# Patient Record
Sex: Female | Born: 1960 | Race: White | Hispanic: No | Marital: Single
Health system: Southern US, Community
[De-identification: ages and names within clinical notes are randomized; demographics above are authoritative.]

---

## 2012-02-05 ENCOUNTER — Inpatient Hospital Stay: Payer: Self-pay | Admitting: Internal Medicine

## 2012-02-05 LAB — CBC
HCT: 40.1 % (ref 35.0–47.0)
HGB: 13.5 g/dL (ref 12.0–16.0)
MCH: 31.5 pg (ref 26.0–34.0)
MCHC: 33.6 g/dL (ref 32.0–36.0)
Platelet: 346 10*3/uL (ref 150–440)
WBC: 10.4 10*3/uL (ref 3.6–11.0)

## 2012-02-05 LAB — COMPREHENSIVE METABOLIC PANEL
Alkaline Phosphatase: 120 U/L (ref 50–136)
BUN: 10 mg/dL (ref 7–18)
Bilirubin,Total: 0.2 mg/dL (ref 0.2–1.0)
Chloride: 106 mmol/L (ref 98–107)
Co2: 23 mmol/L (ref 21–32)
Creatinine: 0.72 mg/dL (ref 0.60–1.30)
EGFR (African American): >60
EGFR (Non-African Amer.): >60
Glucose: 104 mg/dL — ABNORMAL HIGH (ref 65–99)
Osmolality: 279 (ref 275–301)
Potassium: 3.8 mmol/L (ref 3.5–5.1)
SGPT (ALT): 18 U/L
Sodium: 140 mmol/L (ref 136–145)
Total Protein: 7.5 g/dL (ref 6.4–8.2)

## 2012-02-05 LAB — TROPONIN I: Troponin-I: <0.02 ng/mL

## 2012-02-05 LAB — CK TOTAL AND CKMB (NOT AT ARMC)
CK, Total: 58 U/L (ref 21–215)
CK-MB: 0.9 ng/mL (ref 0.5–3.6)

## 2012-02-06 LAB — BASIC METABOLIC PANEL
Anion Gap: 12 (ref 7–16)
Co2: 24 mmol/L (ref 21–32)
EGFR (Non-African Amer.): >60
Glucose: 212 mg/dL — ABNORMAL HIGH (ref 65–99)
Osmolality: 285 (ref 275–301)

## 2012-02-06 LAB — CBC WITH DIFFERENTIAL/PLATELET
Basophil #: 0 10*3/uL (ref 0.0–0.1)
Basophil %: 0 %
Eosinophil %: 0 %
HCT: 35.3 % (ref 35.0–47.0)
Lymphocyte %: 10.5 %
MCH: 31.6 pg (ref 26.0–34.0)
MCV: 95 fL (ref 80–100)
Monocyte #: 0.3 10*3/uL (ref 0.0–0.7)
Monocyte %: 2.7 %
Neutrophil #: 8.4 10*3/uL — ABNORMAL HIGH (ref 1.4–6.5)
Platelet: 309 10*3/uL (ref 150–440)
WBC: 9.7 10*3/uL (ref 3.6–11.0)

## 2012-04-30 LAB — COMPREHENSIVE METABOLIC PANEL
Alkaline Phosphatase: 130 U/L (ref 50–136)
BUN: 3 mg/dL — ABNORMAL LOW (ref 7–18)
Bilirubin,Total: 0.5 mg/dL (ref 0.2–1.0)
Calcium, Total: 8.9 mg/dL (ref 8.5–10.1)
Chloride: 105 mmol/L (ref 98–107)
Creatinine: 0.85 mg/dL (ref 0.60–1.30)
EGFR (Non-African Amer.): >60
Glucose: 130 mg/dL — ABNORMAL HIGH (ref 65–99)
Potassium: 3.4 mmol/L — ABNORMAL LOW (ref 3.5–5.1)
SGPT (ALT): 17 U/L
Total Protein: 7.7 g/dL (ref 6.4–8.2)

## 2012-04-30 LAB — CBC
HGB: 13.1 g/dL (ref 12.0–16.0)
MCH: 31.2 pg (ref 26.0–34.0)
MCHC: 33.1 g/dL (ref 32.0–36.0)
MCV: 94 fL (ref 80–100)
Platelet: 326 10*3/uL (ref 150–440)

## 2012-04-30 LAB — CK TOTAL AND CKMB (NOT AT ARMC)
CK, Total: 61 U/L (ref 21–215)
CK-MB: 0.8 ng/mL (ref 0.5–3.6)

## 2012-04-30 LAB — PRO B NATRIURETIC PEPTIDE: B-Type Natriuretic Peptide: 128 pg/mL — ABNORMAL HIGH (ref 0–125)

## 2012-04-30 LAB — PROTIME-INR
INR: 0.9
Prothrombin Time: 12.9 secs (ref 11.5–14.7)

## 2012-05-01 ENCOUNTER — Inpatient Hospital Stay: Payer: Self-pay | Admitting: Internal Medicine

## 2012-06-09 ENCOUNTER — Inpatient Hospital Stay: Payer: Self-pay | Admitting: Internal Medicine

## 2012-06-09 LAB — CBC
MCH: 30.8 pg (ref 26.0–34.0)
MCHC: 33.4 g/dL (ref 32.0–36.0)
MCV: 92 fL (ref 80–100)
Platelet: 340 10*3/uL (ref 150–440)
RDW: 13.7 % (ref 11.5–14.5)

## 2012-06-09 LAB — BASIC METABOLIC PANEL
Anion Gap: 8 (ref 7–16)
Co2: 26 mmol/L (ref 21–32)
Creatinine: 0.66 mg/dL (ref 0.60–1.30)
EGFR (African American): >60
Glucose: 117 mg/dL — ABNORMAL HIGH (ref 65–99)
Osmolality: 275 (ref 275–301)
Sodium: 139 mmol/L (ref 136–145)

## 2012-06-14 LAB — CULTURE, BLOOD (SINGLE)

## 2012-07-09 ENCOUNTER — Inpatient Hospital Stay: Payer: Self-pay | Admitting: Specialist

## 2012-07-09 LAB — COMPREHENSIVE METABOLIC PANEL
Alkaline Phosphatase: 129 U/L (ref 50–136)
Calcium, Total: 8.7 mg/dL (ref 8.5–10.1)
Co2: 27 mmol/L (ref 21–32)
Creatinine: 0.76 mg/dL (ref 0.60–1.30)
EGFR (Non-African Amer.): >60
Osmolality: 276 (ref 275–301)
SGOT(AST): 15 U/L (ref 15–37)
SGPT (ALT): 20 U/L (ref 12–78)
Sodium: 139 mmol/L (ref 136–145)
Total Protein: 7.6 g/dL (ref 6.4–8.2)

## 2012-07-09 LAB — CBC WITH DIFFERENTIAL/PLATELET
Eosinophil #: 0.9 10*3/uL — ABNORMAL HIGH (ref 0.0–0.7)
Eosinophil %: 10.7 %
HCT: 37.6 % (ref 35.0–47.0)
HGB: 12.6 g/dL (ref 12.0–16.0)
Lymphocyte #: 3.4 10*3/uL (ref 1.0–3.6)
MCHC: 33.7 g/dL (ref 32.0–36.0)
MCV: 93 fL (ref 80–100)
Monocyte #: 0.6 x10 3/mm (ref 0.2–0.9)
Monocyte %: 7.6 %
Neutrophil #: 3.4 10*3/uL (ref 1.4–6.5)
Platelet: 346 10*3/uL (ref 150–440)

## 2012-07-09 LAB — CK TOTAL AND CKMB (NOT AT ARMC)
CK, Total: 60 U/L (ref 21–215)
CK, Total: 59 U/L (ref 21–215)
CK, Total: 63 U/L (ref 21–215)
CK-MB: 1.3 ng/mL (ref 0.5–3.6)

## 2012-07-09 LAB — TROPONIN I: Troponin-I: <0.02 ng/mL

## 2012-07-09 LAB — T4, FREE: Free Thyroxine: 1.01 ng/dL (ref 0.76–1.46)

## 2012-07-10 LAB — CBC WITH DIFFERENTIAL/PLATELET
Basophil %: 0.4 %
Eosinophil #: 0 10*3/uL (ref 0.0–0.7)
Eosinophil %: 0 %
HGB: 12.3 g/dL (ref 12.0–16.0)
Lymphocyte %: 10.4 %
MCHC: 33.5 g/dL (ref 32.0–36.0)
Monocyte #: 0.3 x10 3/mm (ref 0.2–0.9)
Neutrophil %: 86.4 %
Platelet: 331 10*3/uL (ref 150–440)
RBC: 3.94 10*6/uL (ref 3.80–5.20)
RDW: 14.1 % (ref 11.5–14.5)
WBC: 10.5 10*3/uL (ref 3.6–11.0)

## 2012-07-10 LAB — BASIC METABOLIC PANEL
Anion Gap: 9 (ref 7–16)
Calcium, Total: 9.2 mg/dL (ref 8.5–10.1)
Chloride: 105 mmol/L (ref 98–107)
Co2: 25 mmol/L (ref 21–32)
Creatinine: 0.68 mg/dL (ref 0.60–1.30)
EGFR (African American): >60
Osmolality: 278 (ref 275–301)
Sodium: 139 mmol/L (ref 136–145)

## 2012-07-10 LAB — MAGNESIUM: Magnesium: 2.3 mg/dL

## 2012-07-15 LAB — CULTURE, BLOOD (SINGLE)

## 2012-08-15 LAB — COMPREHENSIVE METABOLIC PANEL
Albumin: 4.1 g/dL (ref 3.4–5.0)
Alkaline Phosphatase: 155 U/L — ABNORMAL HIGH (ref 50–136)
Anion Gap: 12 (ref 7–16)
BUN: 4 mg/dL — ABNORMAL LOW (ref 7–18)
Bilirubin,Total: 0.3 mg/dL (ref 0.2–1.0)
Calcium, Total: 9 mg/dL (ref 8.5–10.1)
Co2: 24 mmol/L (ref 21–32)
Creatinine: 0.81 mg/dL (ref 0.60–1.30)
Glucose: 97 mg/dL (ref 65–99)
Osmolality: 278 (ref 275–301)
Potassium: 3.3 mmol/L — ABNORMAL LOW (ref 3.5–5.1)
SGOT(AST): 25 U/L (ref 15–37)
SGPT (ALT): 19 U/L (ref 12–78)
Sodium: 141 mmol/L (ref 136–145)
Total Protein: 7.8 g/dL (ref 6.4–8.2)

## 2012-08-15 LAB — CBC WITH DIFFERENTIAL/PLATELET
Basophil #: 0.1 10*3/uL (ref 0.0–0.1)
Basophil %: 1.3 %
Eosinophil #: 0.8 10*3/uL — ABNORMAL HIGH (ref 0.0–0.7)
Eosinophil %: 7.8 %
HCT: 39.2 % (ref 35.0–47.0)
HGB: 13.5 g/dL (ref 12.0–16.0)
MCH: 31.7 pg (ref 26.0–34.0)
MCHC: 34.4 g/dL (ref 32.0–36.0)
MCV: 92 fL (ref 80–100)
Monocyte #: 0.8 x10 3/mm (ref 0.2–0.9)

## 2012-08-16 ENCOUNTER — Inpatient Hospital Stay: Payer: Self-pay | Admitting: Internal Medicine

## 2012-09-08 ENCOUNTER — Inpatient Hospital Stay: Payer: Self-pay | Admitting: Internal Medicine

## 2012-09-08 LAB — TROPONIN I: Troponin-I: <0.02 ng/mL

## 2012-09-08 LAB — CBC WITH DIFFERENTIAL/PLATELET
Basophil #: 0.1 x10 3/mm 3
Basophil %: 0.9 %
Eosinophil #: 1.6 x10 3/mm 3 — ABNORMAL HIGH
Eosinophil %: 13.8 %
HCT: 37.1 %
HGB: 12.4 g/dL
Lymphocyte %: 34.8 %
Lymphs Abs: 4 x10 3/mm 3 — ABNORMAL HIGH
MCH: 31.1 pg
MCHC: 33.4 g/dL
MCV: 93 fL
Monocyte #: 0.8 "x10 3/mm "
Monocyte %: 6.8 %
Neutrophil #: 5.1 x10 3/mm 3
Neutrophil %: 43.7 %
Platelet: 350 x10 3/mm 3
RBC: 3.99 X10 6/mm 3
RDW: 13.7 %
WBC: 11.6 x10 3/mm 3 — ABNORMAL HIGH

## 2012-09-08 LAB — COMPREHENSIVE METABOLIC PANEL WITH GFR
Albumin: 3.7 g/dL
Alkaline Phosphatase: 123 U/L
Anion Gap: 8
BUN: 9 mg/dL
Bilirubin,Total: 0.2 mg/dL
Calcium, Total: 8.9 mg/dL
Chloride: 108 mmol/L — ABNORMAL HIGH
Co2: 26 mmol/L
Creatinine: 0.79 mg/dL
EGFR (African American): >60
EGFR (Non-African Amer.): >60
Glucose: 132 mg/dL — ABNORMAL HIGH
Osmolality: 284
Potassium: 3.6 mmol/L
SGOT(AST): 23 U/L
SGPT (ALT): 17 U/L
Sodium: 142 mmol/L
Total Protein: 7.1 g/dL

## 2012-09-13 LAB — CULTURE, BLOOD (SINGLE)

## 2012-10-08 ENCOUNTER — Emergency Department: Payer: Self-pay | Admitting: Internal Medicine

## 2012-10-08 LAB — CBC
HCT: 38.8 % (ref 35.0–47.0)
HGB: 13.2 g/dL (ref 12.0–16.0)
MCV: 92 fL (ref 80–100)
RBC: 4.23 10*6/uL (ref 3.80–5.20)
WBC: 6.1 10*3/uL (ref 3.6–11.0)

## 2012-10-08 LAB — COMPREHENSIVE METABOLIC PANEL
Albumin: 3.9 g/dL (ref 3.4–5.0)
Anion Gap: 6 — ABNORMAL LOW (ref 7–16)
BUN: 8 mg/dL (ref 7–18)
Calcium, Total: 9.2 mg/dL (ref 8.5–10.1)
Chloride: 105 mmol/L (ref 98–107)
EGFR (Non-African Amer.): >60
Glucose: 121 mg/dL — ABNORMAL HIGH (ref 65–99)
SGOT(AST): 29 U/L (ref 15–37)
SGPT (ALT): 21 U/L (ref 12–78)
Total Protein: 7.3 g/dL (ref 6.4–8.2)

## 2012-10-08 LAB — TROPONIN I: Troponin-I: <0.02 ng/mL

## 2012-11-26 ENCOUNTER — Inpatient Hospital Stay: Payer: Self-pay | Admitting: Internal Medicine

## 2012-11-26 LAB — COMPREHENSIVE METABOLIC PANEL
Alkaline Phosphatase: 100 U/L (ref 50–136)
Anion Gap: 8 (ref 7–16)
BUN: 15 mg/dL (ref 7–18)
Bilirubin,Total: 0.2 mg/dL (ref 0.2–1.0)
Calcium, Total: 8.4 mg/dL — ABNORMAL LOW (ref 8.5–10.1)
Co2: 24 mmol/L (ref 21–32)
EGFR (Non-African Amer.): >60
Glucose: 120 mg/dL — ABNORMAL HIGH (ref 65–99)
Potassium: 3.9 mmol/L (ref 3.5–5.1)
SGOT(AST): 19 U/L (ref 15–37)
SGPT (ALT): 19 U/L (ref 12–78)
Sodium: 140 mmol/L (ref 136–145)
Total Protein: 7.2 g/dL (ref 6.4–8.2)

## 2012-11-26 LAB — CBC
HCT: 36.8 % (ref 35.0–47.0)
HGB: 12.4 g/dL (ref 12.0–16.0)
MCH: 30.5 pg (ref 26.0–34.0)
MCV: 91 fL (ref 80–100)
WBC: 9.1 10*3/uL (ref 3.6–11.0)

## 2012-11-26 LAB — CK TOTAL AND CKMB (NOT AT ARMC): CK-MB: 1.3 ng/mL (ref 0.5–3.6)

## 2012-11-26 LAB — PRO B NATRIURETIC PEPTIDE: B-Type Natriuretic Peptide: 75 pg/mL (ref 0–125)

## 2012-11-27 LAB — CBC WITH DIFFERENTIAL/PLATELET
Basophil %: 0.2 %
Eosinophil %: 0 %
HCT: 35.2 % (ref 35.0–47.0)
HGB: 11.5 g/dL — ABNORMAL LOW (ref 12.0–16.0)
MCH: 29.9 pg (ref 26.0–34.0)
MCHC: 32.8 g/dL (ref 32.0–36.0)
MCV: 91 fL (ref 80–100)
Monocyte #: 0.2 x10 3/mm (ref 0.2–0.9)
Neutrophil #: 9.2 10*3/uL — ABNORMAL HIGH (ref 1.4–6.5)
Platelet: 337 10*3/uL (ref 150–440)
RBC: 3.86 10*6/uL (ref 3.80–5.20)

## 2012-11-27 LAB — BASIC METABOLIC PANEL
Anion Gap: 8 (ref 7–16)
BUN: 14 mg/dL (ref 7–18)
Calcium, Total: 9.1 mg/dL (ref 8.5–10.1)
Co2: 23 mmol/L (ref 21–32)
EGFR (Non-African Amer.): >60
Glucose: 152 mg/dL — ABNORMAL HIGH (ref 65–99)
Sodium: 139 mmol/L (ref 136–145)

## 2012-11-27 LAB — MAGNESIUM: Magnesium: 2.2 mg/dL

## 2013-01-17 ENCOUNTER — Inpatient Hospital Stay: Payer: Self-pay | Admitting: Specialist

## 2013-01-17 LAB — CBC
HCT: 36.5 % (ref 35.0–47.0)
MCHC: 32.6 g/dL (ref 32.0–36.0)
MCV: 91 fL (ref 80–100)
Platelet: 367 10*3/uL (ref 150–440)
RBC: 4.03 10*6/uL (ref 3.80–5.20)
RDW: 14.6 % — ABNORMAL HIGH (ref 11.5–14.5)
WBC: 10.4 10*3/uL (ref 3.6–11.0)

## 2013-01-17 LAB — COMPREHENSIVE METABOLIC PANEL
Alkaline Phosphatase: 117 U/L (ref 50–136)
Anion Gap: 8 (ref 7–16)
BUN: 10 mg/dL (ref 7–18)
Bilirubin,Total: 0.2 mg/dL (ref 0.2–1.0)
EGFR (African American): >60
EGFR (Non-African Amer.): >60
Glucose: 109 mg/dL — ABNORMAL HIGH (ref 65–99)
Osmolality: 277 (ref 275–301)
Potassium: 4.2 mmol/L (ref 3.5–5.1)
SGOT(AST): 24 U/L (ref 15–37)
SGPT (ALT): 21 U/L (ref 12–78)
Total Protein: 7.5 g/dL (ref 6.4–8.2)

## 2013-01-17 LAB — TROPONIN I: Troponin-I: <0.02 ng/mL

## 2013-01-17 LAB — PRO B NATRIURETIC PEPTIDE: B-Type Natriuretic Peptide: 57 pg/mL (ref 0–125)

## 2013-01-17 LAB — CK TOTAL AND CKMB (NOT AT ARMC): CK-MB: 0.6 ng/mL (ref 0.5–3.6)

## 2013-01-18 LAB — BASIC METABOLIC PANEL
BUN: 12 mg/dL (ref 7–18)
Chloride: 105 mmol/L (ref 98–107)
Creatinine: 0.91 mg/dL (ref 0.60–1.30)
EGFR (Non-African Amer.): >60
Glucose: 132 mg/dL — ABNORMAL HIGH (ref 65–99)
Sodium: 139 mmol/L (ref 136–145)

## 2013-01-18 LAB — CBC WITH DIFFERENTIAL/PLATELET
Basophil #: 0 10*3/uL (ref 0.0–0.1)
Basophil %: 0.2 %
Eosinophil #: 0 10*3/uL (ref 0.0–0.7)
Eosinophil %: 0.2 %
MCH: 30.5 pg (ref 26.0–34.0)
MCHC: 33.6 g/dL (ref 32.0–36.0)
Monocyte #: 0 x10 3/mm — ABNORMAL LOW (ref 0.2–0.9)
Monocyte %: 0.6 %
Neutrophil #: 7.4 10*3/uL — ABNORMAL HIGH (ref 1.4–6.5)
Platelet: 324 10*3/uL (ref 150–440)
RBC: 3.84 10*6/uL (ref 3.80–5.20)
RDW: 14.8 % — ABNORMAL HIGH (ref 11.5–14.5)
WBC: 7.9 10*3/uL (ref 3.6–11.0)

## 2013-02-05 ENCOUNTER — Inpatient Hospital Stay: Payer: Self-pay | Admitting: Internal Medicine

## 2013-02-05 LAB — COMPREHENSIVE METABOLIC PANEL
Albumin: 3.9 g/dL (ref 3.4–5.0)
Alkaline Phosphatase: 130 U/L (ref 50–136)
BUN: 11 mg/dL (ref 7–18)
Bilirubin,Total: 0.2 mg/dL (ref 0.2–1.0)
Calcium, Total: 8.6 mg/dL (ref 8.5–10.1)
Creatinine: 0.71 mg/dL (ref 0.60–1.30)
EGFR (African American): >60
Glucose: 106 mg/dL — ABNORMAL HIGH (ref 65–99)
Osmolality: 275 (ref 275–301)
SGOT(AST): 27 U/L (ref 15–37)
Total Protein: 7.4 g/dL (ref 6.4–8.2)

## 2013-02-05 LAB — CBC WITH DIFFERENTIAL/PLATELET
Basophil #: 0.1 10*3/uL (ref 0.0–0.1)
Eosinophil %: 7.9 %
HCT: 36.3 % (ref 35.0–47.0)
HGB: 12 g/dL (ref 12.0–16.0)
MCH: 29.7 pg (ref 26.0–34.0)
MCHC: 32.9 g/dL (ref 32.0–36.0)
MCV: 90 fL (ref 80–100)
Monocyte %: 8.4 %
Neutrophil #: 6.2 10*3/uL (ref 1.4–6.5)
Neutrophil %: 50.1 %

## 2013-02-05 LAB — TROPONIN I: Troponin-I: <0.02 ng/mL

## 2013-02-06 LAB — CBC WITH DIFFERENTIAL/PLATELET
Basophil #: 0 10*3/uL (ref 0.0–0.1)
Basophil %: 0.1 %
Eosinophil %: 0 %
HGB: 11.2 g/dL — ABNORMAL LOW (ref 12.0–16.0)
Lymphocyte #: 0.4 10*3/uL — ABNORMAL LOW (ref 1.0–3.6)
MCH: 30.1 pg (ref 26.0–34.0)
MCHC: 33 g/dL (ref 32.0–36.0)
MCV: 91 fL (ref 80–100)
Monocyte %: 1 %
Neutrophil #: 7.4 10*3/uL — ABNORMAL HIGH (ref 1.4–6.5)
Neutrophil %: 93.6 %
Platelet: 313 10*3/uL (ref 150–440)
RBC: 3.71 10*6/uL — ABNORMAL LOW (ref 3.80–5.20)
WBC: 7.9 10*3/uL (ref 3.6–11.0)

## 2013-02-06 LAB — BASIC METABOLIC PANEL
Calcium, Total: 8.7 mg/dL (ref 8.5–10.1)
Co2: 25 mmol/L (ref 21–32)

## 2013-02-11 LAB — CULTURE, BLOOD (SINGLE)

## 2013-02-28 ENCOUNTER — Inpatient Hospital Stay: Payer: Self-pay | Admitting: Student

## 2013-02-28 LAB — URINALYSIS, COMPLETE
Bacteria: NONE SEEN
Blood: NEGATIVE
Glucose,UR: 50 mg/dL (ref 0–75)
Ketone: NEGATIVE
Leukocyte Esterase: NEGATIVE
RBC,UR: 1 /HPF (ref 0–5)

## 2013-02-28 LAB — CBC
HCT: 34.2 % — ABNORMAL LOW (ref 35.0–47.0)
HGB: 11.5 g/dL — ABNORMAL LOW (ref 12.0–16.0)
MCH: 30.1 pg (ref 26.0–34.0)
Platelet: 308 10*3/uL (ref 150–440)
RBC: 3.81 10*6/uL (ref 3.80–5.20)
WBC: 8.6 10*3/uL (ref 3.6–11.0)

## 2013-02-28 LAB — COMPREHENSIVE METABOLIC PANEL
Anion Gap: 6 — ABNORMAL LOW (ref 7–16)
Calcium, Total: 9 mg/dL (ref 8.5–10.1)
Chloride: 103 mmol/L (ref 98–107)
Creatinine: 0.74 mg/dL (ref 0.60–1.30)
EGFR (Non-African Amer.): >60
Glucose: 106 mg/dL — ABNORMAL HIGH (ref 65–99)
Osmolality: 272 (ref 275–301)
Potassium: 4.6 mmol/L (ref 3.5–5.1)
SGPT (ALT): 22 U/L (ref 12–78)
Total Protein: 7.9 g/dL (ref 6.4–8.2)

## 2013-03-01 LAB — CBC WITH DIFFERENTIAL/PLATELET
Eosinophil #: 0 10*3/uL (ref 0.0–0.7)
Eosinophil %: 0 %
HGB: 11.2 g/dL — ABNORMAL LOW (ref 12.0–16.0)
Lymphocyte %: 10.9 %
MCH: 29.9 pg (ref 26.0–34.0)
MCHC: 33.3 g/dL (ref 32.0–36.0)
MCV: 90 fL (ref 80–100)
Monocyte #: 0.5 x10 3/mm (ref 0.2–0.9)
Neutrophil #: 7.9 10*3/uL — ABNORMAL HIGH (ref 1.4–6.5)
Neutrophil %: 83.9 %
RDW: 14.3 % (ref 11.5–14.5)
WBC: 9.4 10*3/uL (ref 3.6–11.0)

## 2013-03-01 LAB — BASIC METABOLIC PANEL
Anion Gap: 7 (ref 7–16)
BUN: 9 mg/dL (ref 7–18)
Calcium, Total: 9.2 mg/dL (ref 8.5–10.1)
Chloride: 106 mmol/L (ref 98–107)
Co2: 24 mmol/L (ref 21–32)
Glucose: 142 mg/dL — ABNORMAL HIGH (ref 65–99)
Osmolality: 275 (ref 275–301)
Potassium: 4 mmol/L (ref 3.5–5.1)

## 2013-03-02 LAB — BASIC METABOLIC PANEL
BUN: 16 mg/dL (ref 7–18)
Calcium, Total: 9.2 mg/dL (ref 8.5–10.1)
Chloride: 105 mmol/L (ref 98–107)
Co2: 28 mmol/L (ref 21–32)
Creatinine: 0.9 mg/dL (ref 0.60–1.30)
EGFR (African American): >60
EGFR (Non-African Amer.): >60
Osmolality: 279 (ref 275–301)
Sodium: 138 mmol/L (ref 136–145)

## 2013-03-02 LAB — CBC WITH DIFFERENTIAL/PLATELET
Eosinophil #: 0 10*3/uL (ref 0.0–0.7)
Eosinophil %: 0 %
HCT: 31.8 % — ABNORMAL LOW (ref 35.0–47.0)
HGB: 10.5 g/dL — ABNORMAL LOW (ref 12.0–16.0)
Lymphocyte #: 1.1 10*3/uL (ref 1.0–3.6)
MCHC: 33.1 g/dL (ref 32.0–36.0)
MCV: 90 fL (ref 80–100)
Monocyte #: 0.6 x10 3/mm (ref 0.2–0.9)
Monocyte %: 4.1 %
Neutrophil #: 12 10*3/uL — ABNORMAL HIGH (ref 1.4–6.5)
Neutrophil %: 88 %
Platelet: 339 10*3/uL (ref 150–440)
RBC: 3.52 10*6/uL — ABNORMAL LOW (ref 3.80–5.20)
WBC: 13.6 10*3/uL — ABNORMAL HIGH (ref 3.6–11.0)

## 2013-04-02 ENCOUNTER — Inpatient Hospital Stay: Payer: Self-pay | Admitting: Student

## 2013-04-02 LAB — BASIC METABOLIC PANEL
Anion Gap: 5 — ABNORMAL LOW (ref 7–16)
BUN: 7 mg/dL (ref 7–18)
Calcium, Total: 8.6 mg/dL (ref 8.5–10.1)
Creatinine: 0.82 mg/dL (ref 0.60–1.30)
Potassium: 3.4 mmol/L — ABNORMAL LOW (ref 3.5–5.1)

## 2013-04-02 LAB — CBC
MCH: 29.8 pg (ref 26.0–34.0)
MCHC: 33.4 g/dL (ref 32.0–36.0)
MCV: 89 fL (ref 80–100)
Platelet: 386 10*3/uL (ref 150–440)
RBC: 3.94 10*6/uL (ref 3.80–5.20)
RDW: 14.4 % (ref 11.5–14.5)
WBC: 9.1 10*3/uL (ref 3.6–11.0)

## 2013-04-03 LAB — CBC WITH DIFFERENTIAL/PLATELET
Basophil #: 0 10*3/uL (ref 0.0–0.1)
Basophil %: 0.2 %
Eosinophil %: 0 %
HCT: 33.9 % — ABNORMAL LOW (ref 35.0–47.0)
Lymphocyte #: 0.6 10*3/uL — ABNORMAL LOW (ref 1.0–3.6)
MCV: 88 fL (ref 80–100)
Monocyte #: 0.1 x10 3/mm — ABNORMAL LOW (ref 0.2–0.9)
Monocyte %: 1.2 %
Neutrophil #: 4.9 10*3/uL (ref 1.4–6.5)
Platelet: 367 10*3/uL (ref 150–440)
RBC: 3.83 10*6/uL (ref 3.80–5.20)
RDW: 14.5 % (ref 11.5–14.5)

## 2013-04-03 LAB — MAGNESIUM: Magnesium: 2 mg/dL

## 2013-05-07 ENCOUNTER — Inpatient Hospital Stay: Payer: Self-pay | Admitting: Internal Medicine

## 2013-05-07 LAB — COMPREHENSIVE METABOLIC PANEL
Bilirubin,Total: 0.2 mg/dL (ref 0.2–1.0)
Chloride: 106 mmol/L (ref 98–107)
Creatinine: 0.89 mg/dL (ref 0.60–1.30)
EGFR (African American): >60
EGFR (Non-African Amer.): >60
Osmolality: 285 (ref 275–301)
SGOT(AST): 21 U/L (ref 15–37)
SGPT (ALT): 20 U/L (ref 12–78)
Total Protein: 7.3 g/dL (ref 6.4–8.2)

## 2013-05-07 LAB — CBC
HCT: 36.3 % (ref 35.0–47.0)
MCH: 29.2 pg (ref 26.0–34.0)
MCHC: 33.2 g/dL (ref 32.0–36.0)
Platelet: 389 10*3/uL (ref 150–440)
RDW: 14.7 % — ABNORMAL HIGH (ref 11.5–14.5)
WBC: 8.5 10*3/uL (ref 3.6–11.0)

## 2013-05-09 LAB — THEOPHYLLINE LEVEL: Theophylline: 9.5 ug/mL — ABNORMAL LOW (ref 10.0–20.0)

## 2013-07-04 LAB — CBC WITH DIFFERENTIAL/PLATELET
Basophil %: 0.9 %
Eosinophil #: 0.8 10*3/uL — ABNORMAL HIGH (ref 0.0–0.7)
Eosinophil %: 8.3 %
HCT: 35.3 % (ref 35.0–47.0)
HGB: 11.9 g/dL — ABNORMAL LOW (ref 12.0–16.0)
Lymphocyte %: 42.5 %
MCH: 29.4 pg (ref 26.0–34.0)
MCHC: 33.6 g/dL (ref 32.0–36.0)
Monocyte #: 0.9 x10 3/mm (ref 0.2–0.9)
Monocyte %: 8.8 %
Platelet: 414 10*3/uL (ref 150–440)
RDW: 15.4 % — ABNORMAL HIGH (ref 11.5–14.5)
WBC: 9.8 10*3/uL (ref 3.6–11.0)

## 2013-07-04 LAB — PROTIME-INR
INR: 1
Prothrombin Time: 13.1 secs (ref 11.5–14.7)

## 2013-07-05 ENCOUNTER — Inpatient Hospital Stay: Payer: Self-pay | Admitting: Internal Medicine

## 2013-07-05 LAB — COMPREHENSIVE METABOLIC PANEL
Albumin: 3.4 g/dL (ref 3.4–5.0)
Alkaline Phosphatase: 130 U/L (ref 50–136)
Anion Gap: 4 — ABNORMAL LOW (ref 7–16)
BUN: 9 mg/dL (ref 7–18)
Bilirubin,Total: 0.2 mg/dL (ref 0.2–1.0)
Calcium, Total: 9.4 mg/dL (ref 8.5–10.1)
Chloride: 104 mmol/L (ref 98–107)
Co2: 29 mmol/L (ref 21–32)
Creatinine: 0.93 mg/dL (ref 0.60–1.30)
EGFR (African American): >60
EGFR (Non-African Amer.): >60
Glucose: 105 mg/dL — ABNORMAL HIGH (ref 65–99)
Osmolality: 273 (ref 275–301)
Potassium: 3.7 mmol/L (ref 3.5–5.1)
SGOT(AST): 21 U/L (ref 15–37)
Sodium: 137 mmol/L (ref 136–145)
Total Protein: 7 g/dL (ref 6.4–8.2)

## 2013-07-05 LAB — TROPONIN I: Troponin-I: <0.02 ng/mL

## 2013-07-05 LAB — CK TOTAL AND CKMB (NOT AT ARMC): CK, Total: 46 U/L (ref 21–215)

## 2013-07-06 LAB — CBC WITH DIFFERENTIAL/PLATELET
Basophil #: 0 10*3/uL (ref 0.0–0.1)
Eosinophil %: 0 %
HCT: 34 % — ABNORMAL LOW (ref 35.0–47.0)
HGB: 11.5 g/dL — ABNORMAL LOW (ref 12.0–16.0)
Lymphocyte %: 6.5 %
MCV: 88 fL (ref 80–100)
Monocyte #: 0.4 x10 3/mm (ref 0.2–0.9)
Monocyte %: 3.5 %
Neutrophil %: 90 %
Platelet: 425 10*3/uL (ref 150–440)
WBC: 10.8 10*3/uL (ref 3.6–11.0)

## 2013-07-06 LAB — BASIC METABOLIC PANEL
Anion Gap: 8 (ref 7–16)
Co2: 26 mmol/L (ref 21–32)
Creatinine: 0.9 mg/dL (ref 0.60–1.30)
EGFR (Non-African Amer.): >60
Glucose: 171 mg/dL — ABNORMAL HIGH (ref 65–99)
Osmolality: 276 (ref 275–301)
Potassium: 4.4 mmol/L (ref 3.5–5.1)
Sodium: 137 mmol/L (ref 136–145)

## 2013-07-07 LAB — THEOPHYLLINE LEVEL: Theophylline: 10.5 ug/mL (ref 10.0–20.0)

## 2013-08-08 ENCOUNTER — Emergency Department: Payer: Self-pay | Admitting: Emergency Medicine

## 2013-08-08 LAB — COMPREHENSIVE METABOLIC PANEL
Anion Gap: 7 (ref 7–16)
Bilirubin,Total: 0.2 mg/dL (ref 0.2–1.0)
Calcium, Total: 9.5 mg/dL (ref 8.5–10.1)
Chloride: 102 mmol/L (ref 98–107)
Creatinine: 0.88 mg/dL (ref 0.60–1.30)
EGFR (Non-African Amer.): >60
Glucose: 97 mg/dL (ref 65–99)
Osmolality: 272 (ref 275–301)
SGPT (ALT): 18 U/L (ref 12–78)
Sodium: 137 mmol/L (ref 136–145)
Total Protein: 7.1 g/dL (ref 6.4–8.2)

## 2013-08-08 LAB — CBC
MCH: 29.8 pg (ref 26.0–34.0)
MCHC: 34.4 g/dL (ref 32.0–36.0)
RDW: 15.6 % — ABNORMAL HIGH (ref 11.5–14.5)

## 2013-08-08 LAB — TROPONIN I: Troponin-I: <0.02 ng/mL

## 2013-08-08 LAB — CK TOTAL AND CKMB (NOT AT ARMC): CK, Total: 49 U/L (ref 21–215)

## 2015-02-19 NOTE — Discharge Summary (Signed)
PATIENT NAME:  Cassandra Porter, Cassandra Porter MR#:  [id number] DATE OF BIRTH:  1961-07-11  DATE OF ADMISSION:  09/08/2012 DATE OF DISCHARGE:  09/09/2012  PRESENTING COMPLAINT: Shortness of breath.   DISCHARGE DIAGNOSES:  1. Acute on chronic obstructive pulmonary disease exacerbation.  2. Acute on chronic respiratory failure.   CONDITION ON DISCHARGE: Fair. Saturations are 93% on 3 liters nasal cannula oxygen.   CODE STATUS:  FULL CODE.      DISCHARGE MEDICATIONS:  1. Prednisone taper.  2. Melatonin 5 mg, 3 tablets at bedtime.  3. DuoNebs 3 mL every 4 hours as needed.  4. Theophylline ER 200 mg b.i.d.  5. Advair 250/50, 1 puff b.i.d.  6. Spiriva 18 mcg inhalation daily.  7. Protonix 40 mg daily.  8. Azelastine 1 spray nasally b.i.d.  9. Anemia 5 mg, 1 tablet once a day at bedtime.   HOME OXYGEN: Continue your home oxygen as before, 3 liters nasal cannula continuous.   FOLLOWUP:  1. Follow up with Dr. Meredeth IdeFleming on appointment in November.  2. Follow up with Doristine MangoElizabeth White, Nurse Practitioner on 09/26/2012 at 9:30 a.m. [facility].   BRIEF SUMMARY OF HOSPITAL COURSE: Cassandra Porter is a 54 year old Caucasian female well known to our service from many admissions for similar complaints, comes in with:   1. Acute on chronic respiratory failure secondary to chronic obstructive pulmonary disease exacerbation: The patient was started on IV Solu-Medrol, changed to p.o. taper. She improved well, continued on her Advair, Spiriva, theophylline. She was already on home oxygen. Smoking cessation was advised. The patient states she quit about two days ago.  2. GERD: Continue Protonix.  3. Tobacco abuse: The patient reports quitting two days ago.  4. History of insomnia: The patient takes Ambien at night.   Overall hospital stay remains stable.   CODE STATUS: The patient remained a FULL CODE.   TIME SPENT: 40 minutes.   ____________________________ Wylie HailSona A. Allena KatzPatel, MD sap:cbb D: 09/09/2012  15:03:25 ET T: 09/10/2012 16:28:55 ET JOB#: [id number]  cc: Cinde Ebert A. Allena KatzPatel, MD, <Dictator> Herbon E. Meredeth IdeFleming, MD Courtney ParisElizabeth B. Cliffton AstersWhite, NP Willow OraSONA A Charish Schroepfer MD ELECTRONICALLY SIGNED 09/13/2012 1:25

## 2015-02-19 NOTE — H&P (Signed)
PATIENT NAME:  Cassandra Porter, Cassandra Porter MR#:  [id number] DATE OF BIRTH:  22-Jan-1961  DATE OF ADMISSION:  08/16/2012  PRIMARY CARE PHYSICIAN: None  PULMONOLOGIST: Dr. Meredeth IdeFleming   CHIEF COMPLAINT: Shortness of breath for last couple of days.   HISTORY OF PRESENT ILLNESS: Patient is a 54 year old Caucasian female well known to our service from previous admissions has history of chronic respiratory failure on home oxygen, comes in with increasing shortness of breath and respiratory distress over last couple of days. She denies any fever. Has dry cough. Patient states denies any orthopnea, edema or pedal edema. She denies any sick contact or any recent long distance travel. She started noticing some increasing chest tightness, took her inhalers and breathing treatment, however, continued to feel poorly, came to the Emergency Room, was found to be in the upper 90s on 2 liters nasal cannula oxygen. Patient currently is getting a breathing treatment, is feeling a little bit better. She received Solu-Medrol en route by EMS. Patient is being admitted for acute on chronic respiratory failure secondary to chronic obstructive pulmonary disease exacerbation.   PAST MEDICAL HISTORY:  1. Chronic obstructive pulmonary disease, chronic, on home oxygen.  2. Continued tobacco abuse. Patient reports now she smokes about 1 to 2 or at the most 3 times a week and when she is under stress.  3. Gastroesophageal reflux disease.   SOCIAL HISTORY: She lives at home with family. Prior history of heavy tobacco abuse. She smokes occasionally now, mainly when she is under stress. Denies any alcohol use.    ALLERGIES: No known drug allergies.   MEDICATIONS: According to the last discharge summary of 07/11/2012 are:  1. Spiriva 18 mcg inhalation daily.  2. Protonix 40 mg daily.  3. Albuterol ipratropium SVNs every four hours as needed.  4. Robitussin allergy and cough 10 mL q.4-6 p.r.n.  5. Nicotine apply to affected area daily.   6. Ambien 5 mg at bedtime.  7. Ocean nasal spray.  8. Humibid LA 600 b.i.d.  9. Fluticasone.  10. Advair 250/50, 1 puff b.i.d.  11. Theophylline 200 mg extended-release p.o. b.i.d.  12. Home oxygen 2 liters per minute continuous.    REVIEW OF SYSTEMS: CONSTITUTIONAL: No fever, fatigue, weakness. EYES: No blurred or double vision. No glaucoma. ENT: Positive for sinus congestion. No tinnitus, ear pain, hearing loss. RESPIRATORY: Positive for chronic obstructive pulmonary disease and shortness of breath. CARDIOVASCULAR: No chest pain, orthopnea, edema. No hypertension. GASTROINTESTINAL: No nausea, vomiting, diarrhea, or abdominal pain, gastroesophageal reflux disease. GENITOURINARY: No dysuria, hematuria. ENDOCRINE: No polyuria, nocturia. HEMATOLOGY: No anemia or easy bruising. SKIN: No acne, rash. MUSCULOSKELETAL: Positive for arthritis. NEUROLOGIC: No cerebrovascular accident, transient ischemic attack. PSYCH: Mild anxiety. No depression. All other systems reviewed and negative.   PHYSICAL EXAMINATION:  GENERAL: Patient is awake, alert, oriented x3, not in acute distress.   VITAL SIGNS: She is afebrile, pulse 102 to 103 regular rhythm, rate, tachycardic, blood pressure 93/67, sats 96% on 3 liters nasal cannula oxygen.   HEENT: Atraumatic, normocephalic. Pupils are equal, round, and reactive to light and accommodation. Extraocular movements are intact. Oral mucosa is dry.   NECK: Supple. No JVD. No carotid bruit.   RESPIRATORY: Bilateral expiratory wheezing present. Decreased breath sounds in the bases. No murmur heard. PMI not lateralized. Chest nontender.   EXTREMITIES: Good pedal pulses, good femoral pulses. No lower extremity edema.   ABDOMEN: Soft, nontender. No organomegaly. Positive bowel sounds.   NEUROLOGIC: Grossly intact cranial nerves II through XII. No motor  or sensory deficits.   PSYCH: Patient is awake, alert, oriented x3, mild anxiety present.   SKIN: Warm and dry.    LABORATORY, DIAGNOSTIC AND RADIOLOGICAL DATA: EKG shows sinus tachycardia. Chest x-ray consistent with chronic obstructive pulmonary disease, no acute abnormality.   ASSESSMENT: 54 year old Ms. Cassandra Porter with history of chronic obstructive pulmonary disease on oxygen comes in with:  1. Acute on chronic respiratory failure secondary to chronic obstructive pulmonary disease exacerbation. Will admit patient to regular floor. Continue IV Solu-Medrol around the clock along with oral inhalers, nebulizer treatment, theophylline, Spiriva, Advair. Will taper steroids as needed. Patient is on chronic home oxygen.  2. Ongoing tobacco abuse intermittently, patient although has cut down significantly according to her report. She is still advised on smoking cessation, about three minutes spent.  3. Gastroesophageal reflux disease. Will continue Protonix.  4. Hypokalemia. Will replace p.o. potassium.  5. Further work-up according to patient's clinical course. Hospital admission plan was discussed with patient. No family members present. Patient is a FULL CODE.   TIME SPENT: 50 minutes.   ____________________________ Wylie Hail Allena Katz, MD sap:cms D: 08/16/2012 00:40:44 ET T: 08/16/2012 06:45:23 ET JOB#: [id number]  cc: [name], MD, <Dictator> Herbon E. Meredeth Ide, MD Willow Ora MD ELECTRONICALLY SIGNED 08/24/2012 11:25

## 2015-02-19 NOTE — H&P (Signed)
PATIENT NAME:  Cassandra Porter, Cassandra Porter MR#:  [id number] DATE OF BIRTH:  1960-11-07  DATE OF ADMISSION:  07/09/2012  PRIMARY CARE PHYSICIAN: None.   ER REFERRING PHYSICIAN: Chiquita LothJade Sung, MD   ADMITTING PHYSICIAN: Gaynelle CageMarcel Laurieann Friddle, MD   PRESENTING COMPLAINT: Shortness of breath x3 days.   HISTORY OF PRESENT ILLNESS: The patient is a 54 year old lady who comes in with recurrent shortness of breath and respiratory distress over the last three days. She denies any fever. No loss of consciousness. No PND, orthopnea, or pedal edema. No recent long distance travel or sick contacts. She was having increased cough production and increasing respiratory distress for the past one week. For this, she presented to the Emergency Room today and was noted to be in severe respiratory distress and started on BiPAP, and she was referred to the hospitalist. The patient, of note, has been coming to the hospital almost every month for respiratory failure. Last admission was a month ago for a similar problem, and she was treated with Rocephin and Zithromax. The patient, of note, has continued to smoke despite orders for home oxygen.    REVIEW OF SYSTEMS: CONSTITUTIONAL: Positive for fever and weakness. No weight loss or weight gain. EYES: No blurred vision, redness or discharge. ENT: No tinnitus, epistaxis, or difficulty swallowing. RESPIRATORY: Positive for cough which is nonproductive and respiratory distress, shortness of breath. CARDIOVASCULAR: Denies chest pain, orthopnea, pedal edema. Denies palpitations or syncope. GI: No nausea, vomiting, diarrhea, abdominal pain, change in bowel habits. GU: No dysuria, frequency, or incontinence. ENDOCRINE: No polyuria, polydipsia, heat or cold intolerance. HEMATOLOGIC: No anemia, easy bruising or bleeding. SKIN: No rashes, change in hair or skin texture. MUSCULOSKELETAL: No joint pain, redness, swelling. NEUROLOGIC: No numbness, vertigo, migraine, memory loss. PSYCHIATRIC: No anxiety, depression.    PAST MEDICAL HISTORY:  1. Chronic obstructive pulmonary disease, on home oxygen. 2. Continued tobacco misuse. 3. Gastroesophageal reflux disease.   SOCIAL HISTORY: She lives at home alone. She currently smokes five cigarettes per day. Prior history of heavy tobacco use. Social alcohol use. Not any drug abuse.   FAMILY HISTORY: Father died from chronic obstructive pulmonary disease and was a smoker.   ALLERGIES: No known drug allergies.   MEDICATIONS:  1. Ambien 5 mg at bedtime p.r.n.  2. Advair Diskus 100/50 mcg, 1 puff twice a day. 3. Combivent 2 puffs every 4 hours.  4. Dulera 2 puffs twice a day. 5. Spiriva 18 mcg daily.  6. Ocean nasal moisturizer, 1 spray to both nostrils every 6 hours p.r.n.  7. Protonix 40 mg daily.  8. Reported to be on nicotine patch.  9. Humibid LA 600 mg twice a day. 10. Robitussin 10 mL every 4 to 6 hours p.r.n.   PHYSICAL EXAMINATION:  VITAL SIGNS: Temperature 97.1, pulse 100, respiratory rate 24, blood pressure 138/78, saturations are 98% on BiPAP.   GENERAL: A middle-aged lady looking older than stated age, sitting up on the gurney, awake, alert, oriented to time, place, and person, in respiratory distress. Accessory muscles of respiration used.   HEENT: Atraumatic, normocephalic. Pupils are equal, reactive to light and accommodation. Extraocular movement is intact. BiPAP in situ. Mucous membranes pink, moist.   NECK: Supple. No JV distention.   CHEST: Decreased air entry. Generalized rhonchi. No rales.   HEART: Regular rate and rhythm. No murmur.   ABDOMEN: Full, moves with respiration, nontender. Bowel sounds normoactive. No organomegaly.   EXTREMITIES: No edema, clubbing, deformity.   NEUROLOGICAL: No focal deficits.   PSYCHIATRIC: Affect  appropriate to situation.   LABORATORY, DIAGNOSTIC AND RADIOLOGICAL DATA: EKG showed normal sinus rhythm, rate of 99, no acute ST wave changes. Chest x-ray showed no obvious infiltrates. CBC: White  count is 8.3, hemoglobin 12, platelets 346. Chemistry unremarkable. Creatinine 0.7, BUN 6, glucose 117. Normal LFT. Troponin negative.   IMPRESSION:  1. Acute respiratory failure.  2. Chronic obstructive pulmonary disease exacerbation.  3. Tobacco misuse. 4. Gastroesophageal reflux disease.   PLAN:  1. We will admit the patient to telemetry floor.  2. Continue respiratory support with nebulizers, oxygen, antibiotics, Levaquin, steroid therapy.  3. A CT angiogram to rule out any pulmonary embolism and currently delineate the patient's respiratory structures.  4. Nicotine patch offered. Smoking cessation advised.  5. Serial cardiac enzymes. Check TSH, magnesium.  6. GI prophylaxis with Protonix.  7. Deep vein thrombosis prophylaxis with Lovenox.   CODE STATUS:  FULL CODE.    TOTAL PATIENT CARE TIME: 50 minutes critical care time as this patient is at risk of having severe respiratory failure to respiratory arrest.  ____________________________ Floy Sabina. Tilda Franco, MD mia:cbb D: 07/09/2012 07:29:38 ET T: 07/09/2012 09:17:53 ET JOB#: [id number] Margaret Pyle MD ELECTRONICALLY SIGNED 07/09/2012 20:15

## 2015-02-19 NOTE — Consult Note (Signed)
PATIENT NAME:  Cassandra Porter, Emelin MR#:  [id number] DATE OF BIRTH:  Nov 19, 1960  DATE OF CONSULTATION:  07/09/2012  REFERRING PHYSICIAN:  Dr. Tilda FrancoAkuneme  CONSULTING PHYSICIAN:  Emerie Vanderkolk E. Meredeth IdeFleming, MD  REASON FOR CONSULTATION: Chronic obstructive pulmonary disease exacerbation.   HISTORY OF PRESENT ILLNESS: Cassandra Porter is a 54 year old lady who continues to smoke, however, she is down from 4 packs to 2 cigarettes a day. She was at home and developed increasing shortness of breath or cough, some chest heaviness, and called her sister as she was having difficulty and hence was brought to the Emergency Room promptly and was noted to be in impending respiratory distress, was started on BiPAP. Hospitalist was consulted and was aggressively treated and transferred to the ICU for continued care. Presently she is in the ICU on 3 liters nasal cannula with sats ranging between 93 to 95%. She is not having any respiratory distress, able to speak in full sentences. Chest heaviness or tightness is now resolved, less coughing, minimum wheezing. She was in the hospital last month with similar complaint and was treated with Rocephin and Zithromax as well as her usual COPD regimen. She relates today that she is on 2 liters nasal cannula at home. She takes albuterol nebulizer treatments as well as Spiriva, Dulera, Advair, and Symbicort. She is not being followed by Pulmonary. She has had a DVT in the past but no pulmonary embolism. Presently no pleurisy, hemoptysis. No bleeding. No passing out spells. She gets quite dyspneic just to walk from her bedroom to her bathroom. Hence, she has not been very mobile. No recent long distance travel. No pets in the house. No mold. No feather pillows. No other triggers in her history to suggest a reason for this relapse.   PAST MEDICAL HISTORY:  1. Chronic obstructive pulmonary disease, on home oxygen. 2. Continued nicotine abuse.  3. Gastroesophageal reflux disease.   ALLERGIES: No known  drug allergies.  PREADMISSION MEDICATIONS:  1. Ambien 5 mg p.o. at bedtime.  2. Advair Diskus 100/50 one puff b.i.d.  3. Combivent 2 puffs q.4 hours p.r.n.  4. Dulera 2 puffs b.i.d.  5. Spiriva 1 puff daily.  6. Ocean nasal spray q.6 hours p.r.n.  7. Protonix 40 mg daily.  8. Nicotine patch. 9. Humibid LA 600 mg b.i.d.  10. Robitussin 2 teaspoons q.4 to 6 hours p.r.n.  11. Symbicort, questionable dose.   SOCIAL HISTORY: Lives at home alone. Smokes between 2 to 5 cigarettes a day. Used to smoke 4 packs of cigarettes. Social drinker. No street drugs.   OCCUPATIONAL HISTORY: Has worked in [employer] where she has been exposed to asbestos. Has worked at [employer], hospitals, nursing homes, [employer].   FAMILY HISTORY: Father died from COPD.  REVIEW OF SYSTEMS: No headache, dizziness, passing out spells, significant postnasal drip, sinus pressure, nosebleeds, or change in voice. No neck pain, stiffness, or parotid gland enlargement. No dysphagia, gastroesophageal reflux disease, or choking spells today. RESPIRATORY: As per above. No pleurisy or hemoptysis. No palpitations. No nausea, vomiting, diarrhea, bright red blood in stools, history of flank pain, rashes, nonhealing ulcers. No lymph nodes. No bleeding. No suicide ideation, TIA, seizures, numbness, passing out spells. The rest of the review today was noncontributory.   PHYSICAL EXAMINATION:   VITAL SIGNS: Temperature 97.6, pulse 72, respirations 24, blood pressure 112/59, oxygen sats 92% on 3 liters.   GENERAL: Pleasant lady sitting up in a chair with good color, glasses on, nasal cannula O2 on. Good color. Able to  speak in full sentences. No pressured speech. No audible wheezing. No significant use of accessory muscles.   HEENT: Normocephalic, nontraumatic. Extraocular movements intact. Clear oropharynx. No thrush. No exudates or lesions. Nares benign.   NECK: Supple. No JVD. No thyromegaly. No stridor. Trachea midline. No sub-Q  emphysema.   CHEST: Rare wheeze.   CARDIAC: Regular rhythm. No ectopy, murmurs.   ABDOMEN: Soft, nontender.   EXTREMITIES: No edema, cyanosis, or Homans sign. No color change or temperature change. Moving all extremities without any restriction.   LYMPH: There were no obvious focal deficits.   PSYCHIATRIC: Appropriate affect, reasonable insight.   LABORATORY, DIAGNOSTIC, AND RADIOLOGICAL DATA: On chest x-ray there was no evidence of pneumonia, congestive heart failure. Hyperinflation noted.   ABG pH of 7.36, pCO2 45, pO2 47, FiO2 of 40 on BiPAP. TSH 8.12. Magnesium 2. Cardiac enzymes negative. Sodium 139, potassium 4.1, chloride 103, CO2 27, calcium 8.7. Liver enzymes normal. WBC 8.3, hemoglobin 12.6, hematocrit 37.6, platelets 346, MCV 93. Eosinophil counts are within normal limits.    IMPRESSION: This is a 54 year old lady who is a known nicotine abuser with COPD who comes in with what sounds like another bout of COPD exacerbation. On looking at her medications, she seems to be on multiple long-acting beta-2 agonist and inhaled steroids, hence, need to adjust her medications accordingly. I do not think that she has had a pulmonary embolism and does not appear to have a cardiac cause for her exacerbation today. Hence, I do think at a minimum she should be placed on IV steroids for exacerbation and switch her to p.o. prednisone. Of the combination long-acting beta-2 agonist preparations, we are going to go with Symbicort 160/.4.5 two puffs b.i.d., Spiriva one cap daily, albuterol nebulizer treatments. Consider adding Theo-Dur 200 mg b.i.d. DVT prophylaxis. Empirically antibiotics would be reasonable. If the  repeat gas looks good, she should be transferred from the ICU later on this afternoon or first thing in the morning. We will be willing to follow her on an outpatient as well Lastly, we also discussed the need for her to finally get off of her cigarettes. Will reassess in the a.m.   Again,  thank you for the referral.  ____________________________ Tricia [place] E. Meredeth Ide, MD hef:drc D: 07/09/2012 10:49:46 ET T: 07/09/2012 13:33:41 ET JOB#: [id number] cc: Axell Trigueros E. Meredeth Ide, MD, <Dictator> Mertie Moores MD ELECTRONICALLY SIGNED 07/11/2012 14:56

## 2015-02-19 NOTE — H&P (Signed)
PATIENT NAME:  Cassandra Porter, GALLERY MR#:  [id number] DATE OF BIRTH:  07-Aug-1961  DATE OF ADMISSION:  09/08/2012  PRIMARY CARE PHYSICIAN: None. PULMONOLOGIST:  Dr. Meredeth Ide  REFERRING PHYSICIAN:  Dr. Brandt Loosen    CHIEF COMPLAINT: Increased shortness of breath and wheezing.   HISTORY OF PRESENT ILLNESS: Cassandra Porter is a 54 year old Caucasian female known to have severe chronic obstructive pulmonary disease and tobacco abuse. She was admitted here the last time three weeks ago with chronic obstructive pulmonary disease exacerbation that started as nasal congestion and progressed. Today she comes in with a similar presentation. A week ago she started to have a stuffy nose and progressively developed shortness of breath with exertion, then wheezing and some dry cough. Over the last two days she became extremely short of breath and then she decided to come to the Emergency Department. Here the patient was found to be in severe distress and was placed on BiPAP treatment. The patient denies having any fever. No sputum production. No chest pain. No hemoptysis. She had a mild nosebleed earlier.     REVIEW OF SYSTEMS: CONSTITUTIONAL: Denies any fever. No chills. No fatigue. EYES: No blurring of vision. No double vision. ENT: No hearing impairment. No sore throat. No dysphagia. CARDIOVASCULAR: No chest pain but reports shortness of breath and wheezing. No edema. No syncope. RESPIRATORY: Reports wheezing, shortness of breath, and dry cough. No hemoptysis. GASTROINTESTINAL: No abdominal pain. No nausea, no vomiting, and no diarrhea. GENITOURINARY: No dysuria. No frequency of urination. MUSCULOSKELETAL: No joint pain or swelling. No muscular pain or swelling. NEUROLOGY: No focal weakness. No seizure activity. No headache. INTEGUMENT: No skin rash. No ulcers. PSYCH: No anxiety. No depression. ENDOCRINE: No polyuria or polydipsia. No heat or cold intolerance.   PAST MEDICAL HISTORY:  1. Again, last admission here was  three weeks ago with chronic obstructive pulmonary disease exacerbation.  2. Tobacco abuse.  3. Gastroesophageal reflux disease. 4. Insomnia.   FAMILY HISTORY: Positive for chronic obstructive pulmonary disease including her father and her grandmother.   SOCIAL HISTORY: The patient is divorced. She lives at home. She has three children who are grown up now. The patient used to work at [employer] but she has been on disability for the last two years.   SOCIAL HABITS: Chronic smoker, usually averaged 4 packs a day since the age of 28. However, she cut down to two cigarettes a day. The patient tells me that she quit smoking two days ago. Alcohol- she drinks an occasional beer.    ADMISSION MEDICATIONS: 1. Azelastine one spray each nostril twice a day.  2. Fluticasone-salmeterol 1 puff twice a day.  3. Prednisone tapering doses.  4. Spiriva 1 inhalation once a day.  5. Theophylline 200 mg twice a day.  6. Protonix 40 mg a day.  7. Melatonin 5-mg tablet, taking 3 tablets a day, that is 15 mg a day at night.  8. Ambien 5 mg at bedtime.   ALLERGIES: Iodinated contrast IVP dyes cause hives and skin rash.   PHYSICAL EXAMINATION:  VITAL SIGNS: Blood pressure 119/49, respiratory rate 28, down now to 20, pulse 101, oxygen saturation 96% while she is on oxygen and BiPAP treatment.   GENERAL APPEARANCE: This is a middle-aged female sitting at the bedside in mild respiratory distress at the time of my examination as she is improving.   HEAD: No pallor. No icterus. No cyanosis.   ENT: Hearing was normal. Nasal mucosa, lips, tongue were normal. She is edentulous, has  dentures for the upper jaw only.   EYES: Examination revealed normal eyelids and conjunctivae. Pupils about 5 mm, equal and reactive to light.   NECK: Supple. Trachea at midline. No thyromegaly. No cervical lymphadenopathy. No masses.   HEART: Normal S1, S2. No S3, S4. No murmur. No gallop. No carotid bruits.   RESPIRATORY: Slight  tachypnea and prolonged expiratory phase. She is using accessory muscles, but only minimally at this time. Earlier she was in respiratory distress. Few wheezing and scattered rhonchi. No rales.   ABDOMEN: Soft without tenderness. No hepatosplenomegaly. No masses. No hernias.   SKIN: No ulcers. No subcutaneous nodules.   MUSCULOSKELETAL: No joint swelling. No clubbing.   NEUROLOGIC: Cranial nerves II through XII are intact. No focal motor deficit.   PSYCHIATRIC: The patient is alert and oriented times three. Mood and affect were normal.   LABORATORY, DIAGNOSTIC, AND RADIOLOGICAL DATA: Chest x-ray showed no consolidation, no effusion. Serum glucose 132, BUN 9, creatinine 0.7, sodium 142, potassium 3.6. Normal liver function tests. CBC showed white count of 11,000, hemoglobin 12, hematocrit 37, platelet count 350. Arterial blood gas showed a pH of 7.39, pCO2 45, pO2 49.   ASSESSMENT:  1. Acute exacerbation of chronic obstructive pulmonary disease.  2. Tobacco abuse.  3. Nasal congestion. 4. Gastroesophageal reflux disease.  5. Insomnia.   PLAN: We will admit the patient to the hospital as an inpatient.  Intensify treatment with DuoNebs q. 4 hours while awake. Continue Spiriva 1 inhalation a day. IV Solu-Medrol.  Continue nasal sprays and the rest of her home medications as above. The patient needs to quit smoking indefinitely and she was advised to do so. Since she cut down to two cigarettes a day  I do not see the necessity to place her on a nicotine patch.   Regarding CODE STATUS, although she told the staff that she is DO NOT INTUBATE status, but do everything else including cardiopulmonary resuscitation if needed. However, in my conversation with her she is stating that she prefers BiPAP first and if that fails then it is okay for her to be intubated. She reiterated that she does not want intubation to be the first choice. Therefore, actually her CODE STATUS is FULL CODE based on that.    TIME SPENT EVALUATING THIS PATIENT: More than 55 minutes.     ____________________________ Carney CornersAmir M. Rudene Rearwish, MD amd:bjt D: 09/08/2012 03:30:41 ET T: 09/08/2012 09:19:28 ET JOB#: [id number]  cc: Carney CornersAmir M. Rudene Rearwish, MD, <Dictator> Herbon E. Meredeth IdeFleming, MD Carney CornersAMIR M Braeleigh Pyper MD ELECTRONICALLY SIGNED 09/15/2012 22:42

## 2015-02-19 NOTE — Discharge Summary (Signed)
PATIENT NAME:  Cassandra Porter, Cassandra Porter MR#:  [id number] DATE OF BIRTH:  [birth year]  DATE OF ADMISSION:  08/16/2012 DATE OF DISCHARGE:  08/17/2012  ADMITTING PHYSICIAN: Enedina FinnerSona Patel, MD  DISCHARGING PHYSICIAN: Enid Baasadhika Tuere Nwosu, MD  PRIMARY CARE PHYSICIAN/PULMONOLOGIST: Ned ClinesHerbon Fleming, MD  CONSULTANTS: None.  DISCHARGE DIAGNOSES:  1. Chronic obstructive pulmonary disease exacerbation.  2. Nasal congestion.  3. Tobacco use disorder.  4. Gastroesophageal reflux disease.  5. Insomnia   DISCHARGE MEDICATIONS: 1. Melatonin 15 mg p.o. at bedtime.  2. DuoNebs 3 mL every four hours as needed for wheezing.  3. Theophylline 200 mg p.o. twice a day. 4. Advair 250/50 one puff twice a day. 5. Spiriva Handi-Haler one capsule daily.  6. Protonix 40 mg p.o. daily.  7. Azelastine nasal spray twice a day, one spray to both nostrils. 8. Prednisone taper.  9. Ambien 5 mg p.o. at bedtime as needed for sleep.   DISCHARGE DIET: Regular diet.   DISCHARGE ACTIVITY: As tolerated.   FOLLOWUP INSTRUCTIONS: PCP follow-up in 1 to 2 weeks.   LABS AND IMAGING STUDIES: WBC 10.6, hemoglobin 13.5, hematocrit 39.2, and platelet count 455.   Sodium 141, potassium 3.3, chloride 105, bicarbonate 24, BUN 4, creatinine 0.81, glucose 97, and calcium 9.0.   ALT 19, AST 25, alkaline phosphatase 155, total bilirubin 0.3, and albumin 4.1.  Troponin is negative. D-dimer is slightly elevated at 0.4.   Chest x-ray is showing hyperinflation of lungs consistent with chronic obstructive pulmonary disease. No acute cardiopulmonary disease is evident.   BRIEF HOSPITAL COURSE: [name] is a 54 year old Caucasian female with known history of smoking and chronic respiratory failure secondary to chronic obstructive pulmonary disease who got admitted secondary to worsening shortness of breath and also nasal congestion at home. 1. Acute chronic obstructive pulmonary disease exacerbation, likely secondary to smoking and also triggered by  weather changes. She was started on IV Solu-Medrol while in the hospital and that has been tapered to p.o. prednisone today. Her wheezing has completely improved. She follows with Dr. Meredeth IdeFleming as an outpatient. She also has a nebulizer at home. She is on theophylline, Spiriva and Advair which she will continue after discharge. She was strongly counseled against smoking at discharge. She was on a nicotine patch while in the hospital.  2. Gastroesophageal reflux disease. On Protonix.  3. Insomnia. On Ambien p.r.n. and also melatonin.   Her course has been otherwise uneventful in the hospital.   DISCHARGE CONDITION: Stable.            DISCHARGE DISPOSITION: Home.   TIME SPENT ON DISCHARGE: 40 minutes. ____________________________ Enid Baasadhika Deja Pisarski, MD rk:slb D: 08/17/2012 13:40:08 ET T: 08/17/2012 14:18:21 ET JOB#: [id number]  cc: Enid Baasadhika Kyann Heydt, MD, <Dictator> Herbon E. Meredeth IdeFleming, MD Enid BaasADHIKA Lynna Zamorano MD ELECTRONICALLY SIGNED 08/23/2012 14:05

## 2015-02-19 NOTE — Discharge Summary (Signed)
PATIENT NAME:  Cassandra Porter, Cassandra Porter MR#:  [id number] DATE OF BIRTH:  10-09-1961  DATE OF ADMISSION:  07/09/2012 DATE OF DISCHARGE:  07/11/2012  For a detailed note, please take a look at the history and physical done on admission by Dr. Tilda FrancoAkuneme.   DIAGNOSES AT DISCHARGE:  1. Acute respiratory failure likely secondary to chronic obstructive pulmonary disease exacerbation. 2. Chronic obstructive pulmonary disease exacerbation due to ongoing tobacco abuse.  3. Gastroesophageal reflux disease.  4. Tobacco abuse.   DIET: Patient is being discharged on a regular diet.   ACTIVITY: As tolerated.   FOLLOW UP: Follow up is with Dr. Meredeth IdeFleming in the next 1 to 2 weeks.   DISCHARGE MEDICATIONS: 1. Spiriva 1 puff daily.  2. Advair 250/50, 1 puff b.i.d.  3. Protonix 40 mg daily.  4. Albuterol ipratropium nebulizers q.4 hours as needed. 5. Humibid LA 600 mg b.i.d.  6. Theophylline 200 mg b.i.d.  7. Levaquin 500 mg daily x5 days. 8. Prednisone taper starting at 40 mg x2 days over the next eight days taper.  9. Patient is told to [facility]ue her [facility] and Symbicort for now.   CONSULTANT DURING THE HOSPITAL COURSE: Dr. Meredeth IdeFleming from pulmonary.   LABORATORY, DIAGNOSTIC AND RADIOLOGICAL DATA: Chest x-ray done on admission showing no evidence of pneumonia, congestive heart failure. Hyperinflation secondary to chronic obstructive pulmonary disease.   BRIEF HOSPITAL COURSE: This is a 54 year old female with medical problems as mentioned above who presented to the hospital due to acute onset of shortness of breath and noted to be in respiratory failure.  1. Acute respiratory failure. This was likely secondary to chronic obstructive pulmonary disease exacerbation. Patient initially in the ER was started on some BiPAP. She was admitted to the Intensive Care Unit but shortly improved after being on BiPAP. Was started on high dose IV steroids along with around-the-clock nebulizer treatments, empiric antibiotics. A  pulmonary consult was obtained. Patient was also started on theophylline as per them. Patient's clinical symptoms have significantly improved and she is back down to her baseline oxygen. She currently is being discharged now on a prednisone taper with empiric antibiotics and continuation of her Advair, Spiriva. Theophylline was also added to her regimen for treatment for chronic obstructive pulmonary disease. She will have close follow up with pulmonary as an outpatient.  2. Chronic obstructive pulmonary disease exacerbation. This was likely secondary to ongoing tobacco abuse. A chest x-ray did not show any evidence of acute pneumonia or congestive heart failure. Again, she was treated with IV steroids, around-the-clock nebulizer treatments, empiric antibiotics, and maintenance of her inhalers. She presently is being discharged on prednisone taper, Levaquin, her Advair, Spiriva, theophylline. She is already on oxygen at home. She will have close follow up with pulmonary as an outpatient.  3. Gastroesophageal reflux disease. Patient was maintained on Protonix, she will resume that upon discharge.  4. CODE STATUS: Patient is a FULL CODE.   TIME SPENT: 35 minutes.   ____________________________ Rolly PancakeVivek J. Cherlynn KaiserSainani, MD vjs:cms D: 07/11/2012 15:16:33 ET T: 07/12/2012 13:07:24 ET JOB#: [id number]  cc: Rolly PancakeVivek J. Cherlynn KaiserSainani, MD, <Dictator> Herbon E. Meredeth IdeFleming, MD  Houston SirenVIVEK J Denali Sharma MD ELECTRONICALLY SIGNED 07/16/2012 13:40

## 2015-02-19 NOTE — Discharge Summary (Signed)
PATIENT NAME:  Cassandra Porter, Cassandra Porter MR#:  [id number] DATE OF BIRTH:  Nov 12, 1960  DATE OF ADMISSION:  06/09/2012 DATE OF DISCHARGE:  06/12/2012  ADMITTING DIAGNOSIS: Chronic obstructive pulmonary disease exacerbation.  DISCHARGE DIAGNOSES: 1. Acute on chronic respiratory failure. 2. Chronic obstructive pulmonary disease exacerbation, acute bronchitis. 3. Ongoing tobacco abuse. 4. History of gastroesophageal reflux disease.   DISCHARGE CONDITION: Stable.   DISCHARGE MEDICATIONS: The patient is to resume her outpatient medications. 1. Spiriva one capsule once daily. 2. Dulera two puffs twice daily.  3. Protonix 40 mg p.o. daily.   ADDITIONAL MEDICATIONS:  1. Prednisone 60 mg p.o. once, on 06/13/2012, then taper by 10 mg every two days until stopped.  2. Zithromax 250 mg p.o. daily for two more days.  3. DuoNebs 3 mL every 4 hours.  4. Robitussin-AC 10 mL every 4 to 6 hours as needed.  5. Nicotine patch 7 mg topically daily.  6. Ambien 5 mg p.o. at bedtime as needed. 7. Ocean Nasal spray 1 to 2 sprays to both nostrils every six hours as needed. 8. Humibid LA 600 mg p.o. twice daily. 9. Advair Diskus 100/50 one puff twice daily. The patient is to use this medication until the patient gets [facility] from her pharmacy.  HOME OXYGEN: 3 liters of oxygen through nasal cannula.   DIET: Regular.   PHYSICAL ACTIVITY LIMITATIONS: As tolerated.  DISCHARGE FOLLOWUP: Followup with the [facility] in two days after discharge.   CONSULTANTS: None.   RADIOLOGIC STUDIES: Chest x-ray, portable single view, 06/09/2012, showed no acute cardiopulmonary disease.   REASON FOR ADMISSION: The patient is a 54 year old Caucasian female with history of chronic respiratory failure, oxygen dependent, still smoking, who presented to the hospital with complaints of shortness of breath as well as cough and wheezing and sputum for the past one week which seemed to be worsening over the past one day. Please refer  to Dr. Nicky Pugh admission note on 06/09/2012.  On arrival to the emergency room, the patient's temperature was 98.2, pulse 110, respiration 30, blood pressure 105/62, and oxygen saturation was 87% on room air. Physical examination revealed bilateral limited air entry, very weak breath sounds, and no significant wheezes, crackles, or rales. The patient was using accessory muscles to breathe.  LABS/RADIOLOGIC STUDIES: On arrival to the hospital, 06/09/2012, the patient's glucose level was 117, otherwise BMP was unremarkable. The patient's CBC showed mild elevation of white blood cell count to 11.5, hemoglobin 13.5, and platelet count 240.  HOSPITAL COURSE: The patient was admitted to the hospital for further evaluation. 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 The patient was admitted to the hospital for further evaluation. She was started on steroids IV as well as inhalation therapy around the clock and antibiotic therapy. The patient's condition improved significantly over the next few days during her stay in the hospital time. On 06/12/2012, the patient felt satisfactory, her lung sounds were better, and it seems to be that air entrance also improved. The patient is to continue prednisone taper as well as Zithromax for two more days to complete course. She is to continue cough medication such as Robitussin AC as well as Humibid and inhalation therapy with DuoNebs. The patient is to resume her [facility], however, while she is waiting for [facility] from her pharmacy the patient is to continue Advair which is going to be given from  the hospital. The patient is to follow up with her primary care physician to establish primary care physician and be seen at the [facility] in next few days after discharge to ensure that the patient is improving.   For ongoing  tobacco abuse, the patient is to continue nicotine replacement therapy. The patient was counseled extensively while she was in the hospital and nicotine replacement therapy was initiated. The patient was agreeable to quit.   In regards to gastroesophageal reflux disease, the patient is to continue Protonix. No changes were made. The patient is being discharged on oxygen. She is to use 3 liters of oxygen through nasal cannula and follow up with her primary care physician and try to wean down to room air if possible.  On the day of discharge, the patient's temperature is 97.9, pulse 86, respirations 18, blood pressure 122/86, and saturation was 94% to 96% on 3 liters of oxygen through nasal cannula at rest.   Of note, the patient's sputum cultures were not obtained, however, the patient's blood cultures were negative.   TIME SPENT: 40 minutes.  ____________________________ Katharina Caperima Tolbert Matheson, MD rv:slb D: 06/12/2012 19:48:54 ET T: 06/13/2012 14:12:49 ET JOB#: [id number]  cc: Katharina Caperima Jann Milkovich, MD, <Dictator> [facility] Treyvion Durkee MD ELECTRONICALLY SIGNED 06/16/2012 14:45

## 2015-02-19 NOTE — H&P (Signed)
PATIENT NAME:  Cassandra Porter, Cassandra Porter MR#:  [id number] DATE OF BIRTH:  08/09/1961  DATE OF ADMISSION:  06/09/2012  REFERRING PHYSICIAN: Governor Rooks, MD  PRIMARY CARE PHYSICIAN: None.    CHIEF COMPLAINT: Shortness of breath, cough, sputum for about one week, worsening today.   HISTORY OF PRESENT ILLNESS: The patient is a 54 year old Caucasian female with a history of chronic obstructive pulmonary disease, on home oxygen, presented to the ED with shortness of breath, cough, with sputum for about one week and worsening for the past one day. She said that she even could not walk about 10 feet due to shortness of breath. She said that she ran out of Symbicort two weeks ago but is taking Dulera and Spiriva without relief. The patient is still smoking about five cigarettes a day. The patient has similar symptoms and was admitted for chronic obstructive pulmonary disease exacerbation last month.   PAST MEDICAL HISTORY:  1. Chronic obstructive pulmonary disease, on home oxygen. 2. Tobacco use.   SOCIAL HISTORY: Smoking five cigarettes a day but she had quit two weeks ago. The patient was a heavy smoker before. She said she drinks one can of beer every day. She denies any drug abuse.   FAMILY HISTORY: Father died from chronic obstructive pulmonary disease and was a smoker   ALLERGIES: No known drug allergies.   MEDICATIONS:  1. Dulera 2 puffs inhalation twice a day. 2. Protonix 40 mg p.o. daily.  3. Spiriva 18 mcg inhalation, 1 puff once daily.    REVIEW OF SYSTEMS: CONSTITUTIONAL: The patient denies any fever or chills. No headache or dizziness. EYES: No double vision, blurred vision. ENT: No postnasal drip, epistaxis, slurred speech, or dysphagia. CARDIOVASCULAR: No chest pain, palpitation, orthopnea, nocturnal dyspnea. No leg edema. PULMONARY: Positive for cough, sputum, shortness of breath, and wheezing. No hemoptysis. GASTROINTESTINAL: No abdominal pain, nausea, vomiting, or diarrhea. No melena or  bloody stool. GENITOURINARY: No dysuria, hematuria, or incontinence. SKIN: No rash or jaundice. ENDOCRINE: No heat or cold intolerance. No polyuria or polydipsia. HEMATOLOGY: No easy bruising or bleeding. NEUROLOGY: No syncope, loss of consciousness or seizure.   PHYSICAL EXAMINATION:  VITAL SIGNS: Temperature 98.2, blood pressure 105/62, respiration rate 30, oxygen saturation 87% on room air, pulse 110.   GENERAL: The patient is alert, awake, oriented, in no acute distress.   HEENT: Pupils are round, equal, and reactive to light and accommodation. Moist oral mucosa. Clear oropharynx.   NECK: Supple. No JVD or carotid bruit. No lymphadenopathy, no thyromegaly.   CARDIOVASCULAR: S1, S2 regular rate and rhythm. No murmurs or gallops.   PULMONARY: Bilateral limited air entry, very weak breath sounds. No obvious wheezing, crackles, or rales. Mild use of accessory muscles to breathe.   ABDOMEN: Soft. No distention or tenderness. No organomegaly. Bowel sounds present.   EXTREMITIES: No edema, clubbing, or cyanosis. No calf tenderness. Strong bilateral pedal pulses.   SKIN: No rash or jaundice.   NEUROLOGY: No syncope, loss of consciousness or seizure. Power 5/5, sensation intact.  LABORATORY, DIAGNOSTIC AND RADIOLOGICAL DATA: WBC 11.5, hemoglobin 13.5, platelets 348. Glucose 117, BUN 3, creatinine 0.66. Electrolytes normal. Chest x-ray: No obvious infiltrate. EKG showed sinus tachycardia at 105 beats per minute.     IMPRESSION:  1. Chronic obstructive pulmonary disease exacerbation.  2. Acute on chronic respiratory failure, on home oxygen.  3. Tobacco abuse.  4. Mild leukocytosis.   PLAN OF TREATMENT:  1. The patient will be admitted to a Medical floor. We will start Solu-Medrol  IVPB, give DuoNebs p.r.n., continue Spiriva, start Advair, and give Robitussin p.r.n. for cough.  2. Continue oxygen by nasal cannula.  3. Smoking cessation was counseled for about five minutes.   I discussed  the patient's  situation and plan of treatment with the patient.   TIME SPENT: About 45 minutes.   ____________________________ Shaune PollackQing Amiya Escamilla, MD qc:cbb D: 06/09/2012 19:41:23 ET T: 06/10/2012 07:04:58 ET JOB#: [id number]  cc: Shaune PollackQing Yussef Jorge, MD, <Dictator> Shaune PollackQING Hubert Raatz MD ELECTRONICALLY SIGNED 06/10/2012 16:56

## 2015-02-22 NOTE — Discharge Summary (Signed)
PATIENT NAME:  Cassandra Porter, Cassandra Porter MR#:  [id number] DATE OF BIRTH:  [birth year]  DATE OF ADMISSION:  04/02/2013 DATE OF DISCHARGE:  04/09/2013  For history pertaining to HPI and hospital course, until June 6th, please see the H and P dictated by Dr. Juliene PinaMody and interim discharge summary dictated on June 6th by Dr. Nemiah CommanderKalisetti.    DISCHARGE DIAGNOSES: 1.  Acute on chronic respiratory failure secondary to chronic obstructive pulmonary disease exacerbation and being on 3 liters of oxygen ta home.  2.  Depression.  3.  Anxiety.  4.  History of environmental allergies.  5.  Insomnia.  6.  Multiple hospitalizations for chronic obstructive pulmonary disease flare.  DISCHARGE MEDICATIONS:  1.  Chantix 0.5 mg 2 times a day. 2.  Pantoprazole 40 mg daily. 3.  ProAir HFA 90 mcg 2 puffs every 4 hours as needed for shortness of breath. 4.  Loratadine 10 mg daily. 5.  Magnesium 250 mg once a day. 6.  Trazodone 50 mg at bedtime. 7.  Theophylline 200 mg 2 times a day with meals. 8.  Spiriva 18 mcg inhaled 1 cap once a day. 9.  Advair 250/50 mcg inhaled 1 puff 2 times a day. 10.  Prednisone taper 50 mg for a day, then taper x 10 mg daily until done in 5 days.  11.  Albuterol/ipratropium nebs 1 dose inhaled 4 times a day.  OXYGEN:   The patient will be going home with 3 liters of oxygen via nasal cannula.   DIET: Low sodium.   ACTIVITY: As tolerated.   DISCHARGE FOLLOWUP: Please follow with PCP within 1 to 2 weeks.   HOSPITAL COURSE:  Since June 6th, the patient has had significant improvement and is ambulating with her baseline dyspnea on exertion currently. She has been transitioned back on her 3 liters of nasal cannula and IV steroids have been tapered and the patient has been deemed stable for discharge at this point as she is back to her baseline.   CODE STATUS:  The patient is LIMITED CODE.   TOTAL TIME SPENT: 35 minutes.  ____________________________ Krystal EatonShayiq Rocky Gladden, MD sa:sb D: 04/10/2013  08:56:15 ET T: 04/10/2013 09:17:42 ET JOB#: [id number]  cc: Krystal EatonShayiq Korena Nass, MD, <Dictator> Krystal EatonSHAYIQ Tejay Hubert MD ELECTRONICALLY SIGNED 04/28/2013 20:57

## 2015-02-22 NOTE — Discharge Summary (Signed)
PATIENT NAME:  Cassandra Porter, Cassandra Porter MR#:  [id number] DATE OF BIRTH:  [birth year]  DATE OF ADMISSION:  11/26/2012 DATE OF DISCHARGE:  11/28/2012  PRIMARY PULMONOLOGIST:  Dr. Meredeth IdeFleming.   DISCHARGE DIAGNOSES:  Chronic obstructive pulmonary disease exacerbation, acute on chronic respiratory failure, nasal congestion, gastroesophageal reflux disease, tobacco abuse smoking.   HISTORY OF PRESENT ILLNESS:  This is a 54 year old female with past medical history of COPD, current smoker.  She has multiple admissions in the past six months for the same reason.  She had worsening of her respiratory status for the past 3 to 4 days before presentation to the ER and associated with dry cough.  No fever, was feeling short of breath, usually uses 2 to 3 liters of nasal cannula oxygen, but she was feeling increasingly short of breath and tried using a nebulizer and hand inhalers, but did not got any help so finally decided to come to Emergency Room.  EMT gave her DuoNeb 5 times.  In the ER, she received IV steroid with magnesium.  She received nebulizer treatment, but she did not improve and was feeling excessively short of breath so finally she was started on BiPAP in ER.  Initially she was having agitation and fighting with the BiPAP so Ativan 2 mg was given and then she became more comfortable and cooperative and her respiratory status showed some improvement on BiPAP.   HOSPITAL COURSE AND STAY:  Because of COPD exacerbation and acute respiratory failure as mentioned above she was started on BiPAP, IV steroids and on IV Rocephin and IV azithromycin with continuation of her home medication for COPD, theophylline, Spiriva and nebulizers.  She had significant improvement in the next two days in her respiratory status and we were able to discharge her with continuation of oral antibiotics and tapering oral steroids at home.    OTHER MEDICAL ISSUES ADDRESSED DURING THE HOSPITAL STAY:   1.  Tobacco abuse.  She was counseled for  smoking cessation and nicotine patch was given while she was in the hospital.  2.  Gastroesophageal reflux disease.  She was given Nexium orally.  3.  Nasal congestion.  We continued Afrin spray while she was in the hospital.   CONSULTATIONS DURING THE HOSPITAL STAY:  Psychiatric consult was called in because of her agitation.  They recommend no medication needed at this time.   IMPORTANT LABORATORY RESULTS DURING THE HOSPITAL STAY:  On admission, glucose was 120, BUN was 15, creatinine was 0.72, sodium was 140, potassium was 3.9, CO2 was 24 and magnesium was 8.4.  WBC was 9.1, hemoglobin was 12.4 and platelet count was 340, pH was 7.26, pCO2 was 56 and pO2 was 106, FiO2 was 60% on BiPAP which improved later on to 7.33, pCO2 was 48 and pO2 was 80 on 40% of BiPAP and she was continued on BiPAP and later on was comfortable with nasal cannula oxygen supplementation.  Chest x-ray, portable, on admission, no acute cardiopulmonary disease and repeated the next day showed the same findings.   CONDITION ON DISCHARGE:  Stable.   CODE STATUS ON DISCHARGE:  LIMITED CODE.  SHE SAID NO FOR INTUBATION AND NO FOR VENTILATION, BUT YES FOR CPR, YES FOR ANTIARRHYTHMIC DRUGS AND YES FOR DEFIBRILLATION.  MEDICAL RECONCILIATION ON DISCHARGE:  1.  DuoNebs every 4 hours as needed for shortness of breath.  2.  Theophylline 200 mg oral tablet extended-release 2 times a day.  3.  Spiriva 18 mcg inhaled once a day.  4.  Protonix 40  mg oral delayed-release tablet once a day.  5.  Azelastine 1 spray 2 times a day.  6.  Trazodone 50 mg oral tablet every other day.  7.  Magnesium oxide 250 mg oral tablet once a day.  8.  Loratadine 10 mg oral tablet once a day.  9.  Mucus relief 2 times a day.  10.  Aleve 220 mg oral tablet 2 times a day.  11.  Advair Diskus 2 times a day.  12.  Prednisone 10 mg tablet, start with 60 and taper 5 mg daily until complete.  13.  Ceftin 500 mg oral tablet 2 times a day for 5 days.  14.   Erythromycin 500 mg once a day for 4 days.  15.  Budesonide 1 puff inhaled 2 times a day.   HOME HEALTH:  No.   HOME OXYGEN:  Yes.   PORTABLE TANK:  Yes, 4 liters via nasal cannula oxygen supplementation.   DIET ON DISCHARGE:  Regular diet, consistency regular.   FOLLOW-UP:  Within 1 to 2 weeks with Dr. Reita Cliche office.     TIME TOTAL SPENT:  Forty-five minutes.     ____________________________ Cassandra Porter Elisabeth Pigeon, MD vgv:ea D: 12/02/2012 17:10:51 ET T: 12/04/2012 00:43:09 ET JOB#: [id number]  cc: Cassandra Porter. Elisabeth Pigeon, MD, <Dictator> Dr. Anselm Lis [facility] MD ELECTRONICALLY SIGNED 12/23/2012 17:52

## 2015-02-22 NOTE — Discharge Summary (Signed)
PATIENT NAME:  Cassandra Porter, Perrie MR#:  [id number] DATE OF BIRTH:  [birth year]  DATE OF ADMISSION:  02/05/2013 DATE OF DISCHARGE:  02/07/2013  ADMISSION DIAGNOSES: 1.  Acute on chronic respiratory failure.  2.  Acute chronic obstructive pulmonary disease exacerbation.    DISCHARGE DIAGNOSES: 1.  Acute on chronic respiratory failure.  2.  Acute on chronic obstructive pulmonary disease exacerbation.  3.  Tachycardia.   CONSULT: Dr. Meredeth IdeFleming.   LABORATORIES AT DISCHARGE: White blood cells 7.9, hemoglobin 11.2, hematocrit 34, platelets of 313. Sodium 137, potassium 4.1, chloride 103, bicarb 25, BUN 12, creatinine 1.16, glucose 205.   Chest x-ray showed no acute cardiopulmonary disease.   HOSPITAL COURSE: This is a very pleasant 54 year old female with history of COPD, on oxygen at home, who presents with acute on chronic obstructive failure from COPD exacerbation. For further details, please refer to the H and P.  1. Acute on chronic respiratory failure from COPD exacerbation as outlined below.  2.  Acute COPD exacerbation with acute bronchitis. No evidence of pneumonia on chest x-ray. The patient was placed on IV steroids, nebulizers, oxygen and actually did quite well. She was titrated to her home oxygen level. Her steroids have been decreased. She is ambulating without any difficulty with shortness of breath.  3.  Tachycardia secondary to acute issues as stated above, which has now resolved.   DISCHARGE MEDICATIONS: 1.  DuoNeb 3 mL q.4h. p.r.n. shortness of breath.  2.  Theophylline 200 mg 1 tablet b.i.d.  3.  Spiriva 18 mcg daily.  4.  Protonix 40 mg daily.  5.  Trazodone 50 mg every other day.  6.  Magnesium oxide 250 mg daily.  7.  Loratidine10 mg daily.  8.  Aleve 220 b.i.d.  9.  Advair Diskus 250/50 b.i.d.  10.  Budesonide 180 mcg b.i.d.  11.  Zetonna 37 mcg inhalation spray daily.  12.  Prednisone taper starting at 20 mg, 3 tablets daily x 3 days, taper every 3 days.  13.   Nicotine patch 21 mg per 24 hours.  14.  Levaquin 500 mg daily for 4 days.   DISCHARGE OXYGEN: Four liters nasal cannula at home.   DISCHARGE DIET: Regular diet.   DISCHARGE ACTIVITY: As tolerated.   DISCHARGE FOLLOWUP:  The patient has a followup on the 21st of April with Dr. Meredeth IdeFleming.   ____________________________ Janyth ContesSital P. Juliene PinaMody, MD spm:cs D: 02/07/2013 14:24:00 ET T: 02/07/2013 14:46:04 ET JOB#: [id number]  cc: Kristina Mcnorton P. Juliene PinaMody, MD, <Dictator> Dr. Natalia LeatherwoodFleming Elizabeth B. Cliffton AstersWhite, NP Octavie Westerhold P Sophiana Milanese MD ELECTRONICALLY SIGNED 02/08/2013 16:10

## 2015-02-22 NOTE — H&P (Signed)
PATIENT NAME:  Cassandra Porter, Cassandra Porter MR#:  [id number] DATE OF BIRTH:  11-25-1960  DATE OF ADMISSION:  04/02/2013  PRIMARY CARE PHYSICIAN: Lanora Manis B. White, NP    CHIEF COMPLAINT: Shortness of breath.   HISTORY OF PRESENT ILLNESS:  The patient is a 54 year old female with a history of COPD who presents with shortness of breath for the past several days. The patient says that she has been in the hospital 12 times since last April for COPD acute exacerbation. She called EMS. EMS did treat her with nebulizers.  Here is the ER she received 125 of steroids as well as nebulizers. She is now placed on a BiPAP due to ongoing wheezing and shortness of breath.   REVIEW OF SYSTEMS:   CONSTITUTIONAL: No fever. She has fatigue, weakness.  No chills, weight loss or weight gain.  EYES: No blurred vision. No glaucoma or cataracts.  ENT: No ear pain, hearing loss, seasonal allergies, postnasal drip.  RESPIRATORY:  Positive chronic cough, positive wheezing, positive COPD. No painful respirations, hemoptysis. Positive dyspnea.  CARDIOVASCULAR:  No chest pain, orthopnea, edema, arrhythmias, palpitations or syncope. GASTROINTESTINAL:  No nausea, vomiting, diarrhea, abdominal pain, melena or ulcers. GENITOURINARY:  No dysuria or hematuria.  ENDOCRINE: No polyuria or polydipsia.  HEMATOLOGIC/LYMPHATIC: No anemia or easy bruising.  SKIN: No rash or lesions.  MUSCULOSKELETAL:  No limited activity.  NEUROLOGICAL:  NO  history of CVA, TIA or seizures.  PSYCHIATRIC: No history of anxiety or depression.   PAST MEDICAL HISTORY: 1.  Chronic obstructive pulmonary disease, on 4 liters of oxygen at home.  2.  Asthma.  3.  Environmental allergies.    PAST SURGICAL HISTORY:  1.  Cesarean section.  2.  Tubal ligation.  2.  Right knee meniscal tear.   ALLERGIES: IODINE.   MEDICATIONS: 1.  Trazodone 50 mg at bedtime.  2.  Advair Diskus 250/50 b.i.d.  3.  DuoNebs every 4 hours.  4.  Magnesium oxide 250 mg daily.  5.   Mucinex 1 tablet b.i.d.  6.  Protonix 40 mg daily.  7.  Spiriva daily. 8.   Claritin 10 mg daily.  9.  Theophylline 200 mg extended release 2 times a day.   FAMILY HISTORY:  COPD.  SOCIAL HISTORY: The patient lives at home. She uses Chantix, but she was a prior smoker prior to using Chantix.    PHYSICAL EXAMINATION: VITAL SIGNS: Temperature 93, pulse is 24. Blood pressure 138/84, 99% on her BiPAP.  GENERAL: The patient is not in distress on BiPAP machine.  HEENT: Head is atraumatic. Pupils are round and reactive. Sclerae anicteric. Mucous membranes were not examined as the patient is on BiPAP.  NECK: Supple. No JVD, carotid bruit or enlarged thyroid.  CARDIOVASCULAR: Regular rate and rhythm. No murmurs, gallops or rubs. PMI is not displaced.  His lungs very poor air movement with decreased breath sounds throughout. The patient sounds very tight.  BACK: No CVA or vertebral tenderness.  ABDOMEN: Bowel sounds positive. Nontender, nondistended, hard to appreciate organomegaly due to body habitus.  EXTREMITIES: No clubbing, cyanosis or edema.  SKIN: Without rashes or lesions.  NEUROLOGICAL:   Cranial nerves II through XII are grossly intact. No focal deficits.    LABORATORY AND RADIOLOGICAL DATA:  PH of 7.41, pCO2 46, pO2 70, 100% on nonrebreather.  Troponin less than 0.02. White blood cells 9, hemoglobin 11.7, hematocrit 35.2, platelets are 386, sodium 137, potassium 3.4, chloride 103, bicarbonate 29, BUN 7, creatinine 0.82, glucose 148, calcium 8.6.  EKG shows normal sinus rhythm. No ST elevation or depression.  Chest x-ray shows no acute cardiopulmonary disease.   ASSESSMENT AND PLAN: A 54 year old female with history of COPD on 4 liters of oxygen who presents with acute on chronic respiratory failure secondary to acute COPD exacerbation.   1.  Acute on chronic respiratory failure secondary to acute chronic obstructive pulmonary disease as outlined below.  2.  Acute chronic obstructive  pulmonary disease exacerbation:  The patient will be admitted to Medical/Surgical services. We will continue with BiPAP machine, Solu-Medrol, DuoNebs, her outpatient inhalers and theophylline. We will monitor patient carefully. We will also start azithromycin for the treatment of COPD exacerbation.  3.  Smoking dependence:  The patient does have Chantix, and she will continue this as she is currently not smoking.  She was counseled for 4 minutes regarding the use of Chantix and not using cigarettes at the same time and the importance of quitting smoking as for her lungs.  4.  Leg cramps:  The patient was complaining of leg cramps. Her potassium is slightly elevated. She is on magnesium supplements, but I will go ahead and check a magnesium level as well and give her just a little bit of potassium today and will monitor her leg cramps.    CODE STATUS:  The patient is LIMITED CODE STATUS. No ventilator.  CPR okay and antiarrhythmic medications okay.   TIME SPENT: Approximately 50 minutes.    ____________________________ Janyth ContesSital P. Juliene PinaMody, MD spm:cb D: 04/02/2013 22:22:42 ET T: 04/02/2013 22:46:30 ET JOB#: [id number]  cc: Hawkins Seaman P. Juliene PinaMody, MD, <Dictator> Courtney ParisElizabeth B. Cliffton AstersWhite, NP Jaley Yan P Dynasia Kercheval MD ELECTRONICALLY SIGNED 04/03/2013 13:52

## 2015-02-22 NOTE — Discharge Summary (Signed)
PATIENT NAME:  Cassandra Porter, Cassandra Porter MR#:  [id number] DATE OF BIRTH:  Aug 24, 1961  DATE OF ADMISSION:  02/28/2013 DATE OF DISCHARGE:  03/03/2013   CONSULTANT:  Dr. Meredeth IdeFleming from pulmonary.   CHIEF COMPLAINT:  Shortness of breath.   DISCHARGE DIAGNOSES: 1. Acute on chronic respiratory failure from chronic obstructive pulmonary disease exacerbation.  2.  Ongoing tobacco abuse.  3.  Anxiety.  4.  Stage IV emphysema/chronic obstructive pulmonary disease. 5.  Environmental allergies.   DISCHARGE MEDICATIONS: 1.  DuoNeb 0.5 mg/2.5 mg per 3 mL inhaled every 4 hours as needed for shortness of breath.  2.  Protonix 40 mg daily.  3.  Magnesium oxide 250 mg daily.  4.  Advair 250/50 mcg inhaled 1 puff 2 times a day.  5.  Zetonna 37 mcg per inhaled solution 1 spray once a day.  6.  Claritin 10 mg daily.  7.  Spiriva 18 mcg inhaled daily.  8.  Theophylline 200 mg extended-release 1 tab 2 times a day.  9.  Trazodone 50 mg once a day at bedtime.  10.  Prednisone 10 mg tablet. Please take 4 tabs to 2 days, then 3 tabs for 2 days, then 2 tabs for 2 days, then 1 tab for 2 days, then stop.  The patient will be getting discharged with home oxygen 4 liters via nasal cannula.   DIET:  Regular.   ACTIVITY:  As tolerated.   FOLLOWUP:  Please follow with primary care physician within 1 to 2 weeks.   DISPOSITION:  Home.  SIGNIFICANT LABORATORY DATA:  Glucose on arrival 106, BUN 12, creatinine 0.74. LFTs within normal limits. Troponin negative x 1. Initial WBC 8.6, hemoglobin 11.5, platelets 308. Urinalysis not suggestive of infection. VBG showed pH of 7.28, pCO2 of 56 and x-ray of chest, one view, showed finding consistent with COPD.  HISTORY OF PRESENT ILLNESS AND HOSPITAL COURSE:  For full details of the H and P, please see the dictation on April 29 by Dr. Nemiah CommanderKalisetti, but briefly, this is a 54 year old female with end-stage, stage IV COPD, ongoing smoking, asthma, anxiety disorder with multiple  hospitalizations in the last few months who came in with difficulty breathing, was noted to have shortness of breath and hypoxic with sats in the low 80s and was admitted for the hospitalization for the COPD flare and acute on chronic respiratory failure. She was started on IV steroids, oxygen, was up titrated. She was started on nebs, Advair and her Spiriva was resumed, as well as her theophylline.  Pulmonary was consulted and the patient was seen by Dr. Meredeth IdeFleming. She was continued on her trazodone and was given some Xanax for anxiety disorder and she was counseled thoroughly and advised her ongoing tobacco abuse. She did well and after a few days she went back to her baseline. She is currently back on 4 liters and is ambulating without significant shortness of breath. Her ongoing tobacco abuse is significantly contributing to her multiple flares. However, she is unwilling to quit it appears. At this time, she will be discharged with outpatient followup.  The patient is FULL CODE.  TOTAL TIME SPENT:  35 minutes.    ____________________________ Krystal EatonShayiq Sagal Gayton, MD sa:ce D: 03/03/2013 11:42:11 ET T: 03/03/2013 11:57:28 ET JOB#: [id number]  cc: Krystal EatonShayiq Alexcis Bicking, MD, <Dictator> Primary care physician  Krystal EatonSHAYIQ Jersey Ravenscroft MD ELECTRONICALLY SIGNED 03/27/2013 15:10

## 2015-02-22 NOTE — H&P (Signed)
PATIENT NAME:  Cassandra Porter, SHAFRAN MR#:  [id number] DATE OF BIRTH:  Mar 14, 1961  DATE OF ADMISSION:  05/07/2013  PRIMARY CARE PHYSICIAN: Doristine Mango, NP with [facility] PRIMARY PULMONOLOGIST: Ned Clines, MD     CHIEF COMPLAINT: Shortness of breath.   HISTORY OF PRESENTING ILLNESS: A 54 year old female patient with history of COPD, on chronic respiratory failure with 4 liters oxygen at home who has had recurrent visits to the ER and admissions in the recent past with COPD exacerbation, returns with worsening shortness of breath for past few days. Today, this has gotten worse to the point that the patient had to come to the Emergency Room. The patient was placed on a BiPAP with severe shortness of breath, wheezing and chest x-ray showing no infiltrate, and the patient is being admitted to the hospitalist service with COPD exacerbation. The patient is afebrile. She has been using all her inhalers. She quit smoking a few months back, is on Chantix. The patient sees Dr. Meredeth Ide, saw him one month back. The patient does not complain of any chest pain, lightheadedness, palpitations. Dry cough.   PAST MEDICAL HISTORY: 1.  COPD.  2.  Recurrent pneumonias.  3.  Chronic respiratory failure on 4 liters oxygen.  4.  Depression.  5.  Past tobacco abuse.   PAST SURGICAL HISTORY:  1.  C-section and tubal ligation.  2.  Right knee meniscal repair.   ALLERGIES: IODINE.   HOME MEDICATIONS: 1.  Trazodone 50 mg oral at bedtime.  2.  Theophylline 200 mg oral 2 times a day.  3.  Advair 250/50 1 puff b.i.d.  4.  Claritin 10 mg oral daily. 5.  DuoNeb 3 mL every 4 hours.  6.  Magnesium oxide 250 mg oral daily.  7.  Protonix 40 mg oral daily.  8.  Spiriva inhaled capsule daily.   SOCIAL HISTORY: The patient lives at home. Is on 4 liters oxygen. Has quit smoking. Occasionally drinks.   FAMILY HISTORY: Her father had COPD.  REVIEW OF SYSTEMS:   CONSTITUTIONAL: Complains of fatigue and weakness.  No  weight gain, weight loss.  EYES: No blurred vision, pain.  ENT: No tinnitus, ear pain, hearing loss.  RESPIRATORY: Has dry cough and wheezing with COPD, uses home oxygen.  CARDIOVASCULAR: No chest pain, orthopnea, edema.  GASTROINTESTINAL: No nausea, vomiting, diarrhea, abdominal pain.  GENITOURINARY: No dysuria, hematuria or frequency.  ENDOCRINE: No polyuria, nocturia, thyroid problems.  HEMATOLOGIC/LYMPHATIC: No anemia, easy bruising, bleeding.  INTEGUMENTARY: No acne, rash, lesions.  MUSCULOSKELETAL: No back pain, arthritis.  NEUROLOGIC: No focal numbness, weakness, dysarthria.  PSYCHIATRIC: Has anxiety, depression.   PHYSICAL EXAMINATION: VITAL SIGNS: Shows temperature of 98.4, pulse of 99, respirations 26, blood pressure 122/73, saturating 96% on BiPAP.  GENERAL: Moderately-built Caucasian female patient in significant respiratory distress.  PSYCHIATRIC: Alert and oriented x3, anxious.  HEENT: Atraumatic, normocephalic. Oral mucosa moist and pink. External ears and nose normal. No pallor. No icterus. Pupils are bilaterally equal and reactive to light.   NECK: Supple. No thyromegaly. No palpable lymph nodes. Trachea midline. No carotid bruit, JVD.  CARDIOVASCULAR: S1, S2, without any murmurs. Peripheral pulses 2+. No edema.  RESPIRATORY: Decreased air entry in both sides with bilateral wheezing, use accessory muscles. BiPAP.  GASTROINTESTINAL: Soft abdomen, nontender. Bowel sounds present. No hepatosplenomegaly palpable.  SKIN: Warm and dry. No petechiae, rash, ulcers.  MUSCULOSKELETAL: No joint swelling, redness, effusion of the large joints. Normal muscle tone.  NEUROLOGICAL: Motor strength 5 out of 5 in upper and  lower extremities. Sensation to fine touch  intact all over.  LYMPHATIC: No cervical lymphadenopathy.   LABORATORY AND RADIOLOGICAL DATA:  Glucose of 165, BUN 7, creatinine 0.89, sodium 140, potassium 4.2. AST, ALT, alkaline phosphatase, bilirubin normal. Troponin less  than 0.02. WBC 8.5, hemoglobin 12, platelets of 389.  Chest x-ray shows bilateral basilar atelectasis. No infiltrates. No pneumothorax. No pleural effusion. EKG shows sinus tachycardia. No acute ST-T wave changes.   ASSESSMENT AND PLAN: 1.  Acute on chronic respiratory failure needing BiPAP support:  The patient is critically ill, needing BiPAP with acute COPD exacerbation.  We will start her on scheduled IV steroids, nebulizers and Levaquin. No infiltrate on chest x-ray. Does have bilateral basilar atelectasis. We will wean off to oxygen when improved from  from BiPAP. The patient does not want any intubation. I have discussed with this. SHE IS LIMITED CODE WITH NO INTUBATION BUT OKAY FOR CPR, ANTIARRHYTHMIC DRUGS AND VASOACTIVE DRUGS. 2.  Depression:  Continue trazodone.  3.  Past tobacco abuse: The patient is on Chantix.  4.  Deep vein thrombosis prophylaxis:  Lovenox.   CODE STATUS: LIMITED CODE.   TIME SPENT TODAY ON THIS CRITICALLY ILL CASE: 45 minutes.  ____________________________ Molinda BailiffSrikar R. Jamee Pacholski, MD srs:cb D: 05/07/2013 20:07:00 ET T: 05/07/2013 20:23:32 ET JOB#: [id number] cc: Wardell HeathSrikar R. Tenika Keeran, MD, <Dictator> Herbon E. Meredeth IdeFleming, MD Courtney ParisElizabeth B. ErathWhite, FNP Wardell HeathSRIKAR West Bali Catalino Plascencia MD ELECTRONICALLY SIGNED 05/07/2013 21:30

## 2015-02-22 NOTE — H&P (Signed)
PATIENT NAME:  Cassandra Porter, Cassandra Porter MR#:  [id number] DATE OF BIRTH:  08/31/1961  DATE OF ADMISSION:  11/26/2012  PRIMARY CARE PHYSICIAN: Lanora Manis B. White, NP  PRIMARY PULMONOLOGIST: Herbon E. Meredeth Ide, MD  REFERRING EMERGENCY ROOM PHYSICIAN: Humberto Leep. Margarita Grizzle, MD   CHIEF COMPLAINT: Shortness of breath.   HISTORY OF PRESENTING ILLNESS: This is a 54 year old female with past medical history of COPD and current smoker. She has multiple admissions in the last 6 months for the same reason. For the last 3 to 4 days, she has worsening in her respiratory status associated with dry cough. No fever. She was feeling short of breath. Usually she uses 2 to 3 liters nasal cannula oxygen, but she was feeling increasingly short of breath. She tried using her nebulizer and her inhalers, but it did not help with that, so finally she decided to come to the emergency room today. EMT gave her the DuoNeb 5 times. In the ER, she received IV steroids, magnesium, and she received nebulizer treatment, but she did not have improvement, and she was feeling excessively short of breath, so finally she was started on BiPAP. Initially, she was agitated and had a little fighting with the BiPAP, so she was given Ativan 2 mg, and after that, she is more comfortable and cooperative with BiPAP now. So, we are admitting her for acute respiratory failure secondary to COPD exacerbation.   REVIEW OF SYSTEMS: .  CONSTITUTIONAL: Negative for fever, fatigue, weakness, pain, weight loss or weight gain.  EYES: No blurring, double vision, pain or redness.  ENT: No tinnitus, ear pain, hearing loss or discharge.  RESPIRATORY: Has cough and wheezing, but no hemoptysis or painful respiration.  CARDIOVASCULAR: No chest pain, orthopnea, palpitation or arrhythmia.  GASTROINTESTINAL: No nausea, vomiting, diarrhea, abdominal pain.  GENITOURINARY: No dysuria, hematuria or increased frequency.  ENDOCRINE: No polyuria, nocturia.  SKIN: No acne, rashes or  lesions.  MUSCULOSKELETAL: No pain or swelling in the joints.  NEUROLOGICAL: No numbness, weakness, tremor or vertigo.  PSYCHIATRIC: No anxiety, insomnia or depressive symptoms.   PAST MEDICAL HISTORY: Positive for:  1. Chronic obstructive pulmonary disease.  2. Tobacco abuse.  3. Gastroesophageal reflux disease.  4. Insomnia.   FAMILY HISTORY: Positive for chronic obstructive pulmonary disease in father and her grandmother.   SOCIAL HISTORY: She is divorced. Lives at home Three children who are grown up. Used to work at [employer], but she has been on disability for the last 2 years. She is a chronic smoker, and in the past, used to smoke 4 packs a day, but she is cutting down, and currently she is smoking 2 to 3 cigarettes a day. She drinks alcohol occasionally.   HOME MEDICATION:  1. Theophylline 200 mg 2 times a day.  2. Spiriva 18 mcg inhalation once a day. 3. Protonix 40 mg oral once a day.  4. Prednisone. She was given prednisone after last admission, and she tapered, and she is off. 5. Fluticasone/salmeterol 1 puff 2 times a day.  6. DuoNeb 3 mL inhalation every 4 hours. 7. Astelin 137 mcg inhalation nasal spray 1 spray 2 times a day.  8. Ambien 5 mg oral tablet at bedtime.   PHYSICAL EXAMINATION: VITALS: Temperature 96.9, pulse rate 105, respiratory rate 26, blood pressure 119/78 and pulse oximetry 82% on room air on arrival to ER, and 100% on BiPAP 60% FiO2.  GENERAL: She is fully alert, oriented, well developed, well nourished, and in acute distress due to respiratory failure. She is  using BiPAP currently and has difficulty breathing due to shortness of breath.  HEAD AND NECK: Atraumatic. Conjunctivae pink. Oral mucosa did not examine due to BiPAP. Neck is supple. No JVD.  RESPIRATORY: Bilateral air entry decreased. Distant air sound. No crepitation heard. Expiratory wheezing present. Barrel-shaped chest.  CARDIOVASCULAR: S1, S2 present, tachycardia. No murmur appreciated.   ABDOMEN: Soft, nontender. Bowel sounds present. No organomegaly. LEGS: No edema.  SKIN: No rashes.  JOINTS: No swelling or tenderness.  NEUROLOGICAL: Grossly normal. Power 5 out of 5 in all 4 limbs. No tremors.  PSYCHIATRIC: Other than anxiety due to her respiratory status, does not appear to have any acute problems with psychiatric behavior.   LABORATORY RESULTS: Glucose 120. BNP 75. BUN 15, creatinine 0.72, sodium 140, potassium 3.9, chloride 108, CO2 24, calcium 8.4. Total protein 7.2, albumin 3.8, bilirubin 0.2, alkaline phosphatase 100, SGOT 19, SGPT 19. Troponin less than 0.02. WBC 9.1, hemoglobin 12.4, platelet count 340, MCV 91. ABG: pH 7.26, pCO2 56, pO2 106 on 60% FiO2 with BiPAP.   CHEST X-RAY: No acute cardiopulmonary disease.   ELECTROCARDIOGRAM: Sinus tachycardia.   ASSESSMENT AND PLAN: A 54 year old female with history of chronic obstructive pulmonary disease and current smoker, using oxygen at home, presented with acute worsening of her symptoms and respiratory failure.   1. Acute respiratory failure secondary to chronic obstructive pulmonary disease exacerbation. She was given multiple nebulizer treatments without relief. She was given high dose of steroid and magnesium by ER and started on BiPAP, and currently she is feeling a little comfortable on BiPAP. Will continue her on BiPAP for now and will continue IV steroids. Will start her on Rocephin and Zithromax IV and will continue home medication, including theophylline and Spiriva.  2. Tobacco abuse. Will give her nicotine patch while she is in the hospital. Counseling for smoking cessation is done for 5 minutes. She says she is trying to cut down, and currently she is smoking 2 to 3 cigarettes only.  3. Gastroesophageal reflux disease. Nexium orally.  4. Nasal congestion. Continue Astelin spray.  6. Deep vein thrombosis prophylaxis. Will give her heparin subcutaneous.   CODE STATUS: Limited code, she says yes for chest  compression and medications to revive her heart, but she told me clearly no for intubation and ventilator if needed. Her healthcare decision-maker person is her sister, Fuller SongJulie Carmichael, phone number [phone], as noted in the chart.   TOTAL TIME SPENT ON THIS ADMISSION: 55 minutes. She is in critical condition currently, with respiratory failure and need of BiPAP, and she might worsen, and I explained to her in detail, but still she refused, if needed, intubation or ventilator, so we will have to continue her on BiPAP and monitor closely.   ____________________________ Hope PigeonVaibhavkumar G. Elisabeth PigeonVachhani, MD vgv:OSi D: 11/26/2012 09:24:09 ET T: 11/26/2012 13:00:51 ET JOB#: [id number]  cc: Hope PigeonVaibhavkumar G. Elisabeth PigeonVachhani, MD, <Dictator> Altamese DillingVAIBHAVKUMAR Avah Bashor MD ELECTRONICALLY SIGNED 12/23/2012 17:52

## 2015-02-22 NOTE — H&P (Signed)
PATIENT NAME:  Cassandra Porter, Cassandra Porter MR#:  [id number] DATE OF BIRTH:  1960/12/10  DATE OF ADMISSION:  02/05/2013  REFERRING PHYSICIAN:  Kasigluk SinkJade J. Dolores FrameSung, MD  FAMILY PROVIDER:  [name], NP  PULMONOLOGIST:  Ned ClinesHerbon Fleming, MD  REASON FOR ADMISSION:  Acute respiratory failure.   HISTORY OF PRESENT ILLNESS:  The patient is a 54 year old female with a history of COPD, allergies and anxiety who presents to the Emergency Room with acute onset of shortness of breath and inability to compensate. She tried multiple SVNs at home with no improvement. In the Emergency Room, the patient was profoundly hypoxic requiring BiPAP. She was extremely anxious. She was given ketamine with improvement of her anxiety. She was able to tolerate BiPAP. She is now admitted for further evaluation.   PAST MEDICAL HISTORY:   1.  COPD.  2.  Anxiety.  3.  Environmental allergies.  4.  Previous C-sections x 2.   MEDICATIONS:   1.  Trazodone 50 mg p.o. at bedtime.  2.  Theophylline 200 mg p.o. b.i.d.  3.  Spiriva 1 capsule inhaled daily.  4.  Protonix 40 mg p.o. daily.  5.  Mag-Ox 250 mg p.o. daily.  6.  Claritin 10 mg p.o. daily.  7.  DuoNeb SVNs q. 4 hours p.r.n. shortness of breath.  8.  Symbicort 160/4.5 mcg 2 puffs b.i.d.  9.  Astelin nasal spray 1 puff in each nostril b.i.d.   ALLERGIES:  IODINE.   SOCIAL HISTORY:  The patient does smoke. No history of alcohol abuse.   FAMILY HISTORY:  Positive for coronary artery disease and diabetes, otherwise unremarkable.   REVIEW OF SYSTEMS:  CONSTITUTIONAL: No fever or change in weight.  EYES: No blurred or double vision. No glaucoma.  ENT: No tinnitus or hearing loss. No nasal discharge or bleeding. No difficulty swallowing.  RESPIRATORY: The patient has had cough and wheezing, but denies hemoptysis. No painful respiration.  CARDIOVASCULAR: No chest pain or orthopnea. No palpitations or syncope.  GASTROINTESTINAL: No nausea, vomiting, or diarrhea. No abdominal  pain. No change in bowel habits.  GENITOURINARY: No dysuria or hematuria. No incontinence.  ENDOCRINE: No polyuria or polydipsia. No heat or cold intolerance.  HEMATOLOGIC: The patient denies anemia, easy bruising, or bleeding.  LYMPHATIC: No swollen glands.  MUSCULOSKELETAL: The patient denies pain in her neck, back, shoulders, knees, or hips. No gout.  NEUROLOGIC: No numbness, although she does feel weak. Denies migraines, stroke or seizures.  PSYCHIATRIC: The patient denies anxiety, insomnia, or depression.   PHYSICAL EXAMINATION: GENERAL: The patient is acutely ill appearing in moderate respiratory distress.  VITAL SIGNS: Vital signs are currently remarkable for a blood pressure of 171/79 with a heart rate of 118 and a respiratory rate of 40.  HEENT: Normocephalic, atraumatic. Pupils equally round and reactive to light and accommodation. Extraocular movements are intact. Sclerae anicteric. Conjunctivae are clear. Oropharynx is dry, but clear.  NECK: Supple without JVD. No adenopathy or thyromegaly is noted.  LUNGS: Diffuse wheezes and rhonchi with decreased breath sounds throughout. No dullness. Respiratory effort is increased.  CARDIAC: Rapid rate with a regular rhythm. Normal S1, S2. No significant rubs, murmurs, or gallops. PMI is nondisplaced. Chest wall is nontender.  ABDOMEN: Soft, nontender with normoactive bowel sounds. No organomegaly or masses are appreciated. No hernias or bruits are noted.  EXTREMITIES: Without clubbing, cyanosis, edema. Pulses are 2+ bilaterally.  SKIN: Warm and dry without rash or lesions.  NEUROLOGIC: Cranial nerves II through XII grossly intact. Deep tendon  reflexes are symmetric. Motor and sensory examination is nonfocal.  PSYCHIATRIC: Revealed a patient who is anxious, but alert and oriented to person, place, and time. She was cooperative and used good judgment.   DIAGNOSTIC DATA:  Chest x-ray was unremarkable except for COPD changes. EKG revealed sinus  tachycardia with no acute ischemic changes. White count was 12.4 with hemoglobin of 12.0 with glucose of 106 and BUN of 11 with a creatinine of 0.71 and sodium of 138 with potassium of 4.0. Troponin was less than 0.02. ABG on BiPAP revealed pH of 7.35 with a pCO2 of 46 and pO2 of 111 with a saturation of 96%.   ASSESSMENT:   1.  Acute respiratory failure.  2.  Chronic obstructive pulmonary disease exacerbation.  3.  Presumed bronchitis.  4.  Anxiety.  5.  Hyperglycemia.  6.  Leukocytosis.   PLAN:  The patient will be admitted to the Intensive Care Unit on BiPAP with IV steroids, IV antibiotics, and DuoNeb SVNs. We will continue her inhalers and Spiriva. We will use Ativan as needed for anxiety. We will send off blood and sputum cultures. We will obtain a consult from pulmonology in the morning. Followup chest x-ray and labs in the morning. Further treatment and evaluation will depend upon the patient's progress.   TOTAL TIME SPENT ON THIS PATIENT:  50 minutes.    ____________________________ Duane Lope Judithann Sheen, MD jds:si D: 02/05/2013 22:52:13 ET T: 02/05/2013 23:14:00 ET JOB#: [id number]  cc: Duane Lope. Judithann Sheen, MD, <Dictator> Courtney Paris. White, NP Herbon E. Meredeth Ide, MD  Dustin Burrill Rodena Medin MD ELECTRONICALLY SIGNED 02/06/2013 8:06

## 2015-02-22 NOTE — H&P (Signed)
PATIENT NAME:  Cassandra Porter, ZAGAL MR#:  [id number] DATE OF BIRTH:  06-22-1961  DATE OF ADMISSION:  02/28/2013  ADMITTING PHYSICIAN: Gladstone Lighter, MD  PRIMARY CARE PHYSICIAN: Eulogio Bear, MD  PRIMARY PULMONOLOGIST:  Wallene Huh, MD  CHIEF COMPLAINT: Difficulty breathing.   HISTORY OF PRESENT ILLNESS: [name] is a 54 year old female with end-stage COPD stage IV, ongoing smoking, asthma, and anxiety disorder who has had multiple hospitalizations recently, the last one being about 2 weeks ago, admitted for COPD exacerbation, and comes back again with sudden onset of difficulty breathing that started this morning around 4:30 a.m.  She said she was doing better after her last discharge.  She has been exposed to pollen and had time to time allergies lately, but did not develop acute shortness of breath until this morning. She woke up to use the restroom at 4:30 a.m., was coming back from the restroom, she felt she could not breathe and presented to the Emergency Room. She is on 4 liters home oxygen and was found to be hypoxic in the low 80s so is being admitted for COPD exacerbation due to extensive wheezing again on exam.   PAST MEDICAL HISTORY: 1.  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  2.  Asthma. 3.  Environmental allergies.  PAST SURGICAL HISTORY: 1.  C-section.  2.  Tubal ligation.  3.  Right knee meniscal repair.   ALLERGIES: IODINE.   CURRENT MEDICATIONS AT HOME: 1.  Trazodone 50 mg p.o. at bedtime.  2.  Theophylline 200 mg p.o. b.i.d.  3.  Advair 250/50 one puff b.i.d.  4.  Claritin 10 mg p.o. daily.  5.  DuoNebs 3 mL q. 4 hours. 6.  Magnesium oxide 250 mg p.o. daily.  7.  Mucinex 1 tablet p.o. b.i.d.  8.  Protonix 40 mg p.o. daily.  9.  Spiriva inhalation capsule daily.  10.  Astelin nasal spray 1 puff each nostril b.i.d.   SOCIAL HISTORY: Lives at home with elderly lady.  Continues to smoke few puffs of cigarettes every day.  Occasionally drinks  beer.   FAMILY HISTORY: Dad had COPD.  REVIEW OF SYSTEMS:  CONSTITUTIONAL: No fever, fatigue or weakness.  EYES: No blurred vision, double vision, glaucoma or cataracts.  ENT: No tinnitus, ear pain, hearing loss, epistaxis or discharge.   RESPIRATORY:  Positive for cough and wheezing. No hemoptysis.  CARDIOVASCULAR: No chest pain, orthopnea, edema, arrhythmia, palpitations or syncope.  GASTROINTESTINAL: No nausea, vomiting, diarrhea, abdominal pain, hematemesis or melena.  GENITOURINARY: No dysuria, hematuria, renal calculus, frequency or incontinence.  ENDOCRINE: No polyuria, nocturia, thyroid problems, heat or cold intolerance.  HEMATOLOGY: No anemia, easy bruising or bleeding.  SKIN: No acne, rash or lesions.  MUSCULOSKELETAL: No neck, back, or shoulder pain, arthritis or gout.  NEUROLOGIC: No numbness, weakness, CVA, TIA or seizures.  PSYCHOLOGICAL: Positive for severe anxiety. No insomnia or depression.   PHYSICAL EXAMINATION: VITAL SIGNS: Temperature 97.9 degrees Fahrenheit, pulse 106, respiration 26, blood pressure 116/78, and pulse ox 92% on 4 liters oxygen. GENERAL: Well-built, well-nourished female sitting in bed in moderate respiratory distress.  HEENT: Normocephalic, atraumatic. Pupils equal, round and reacting to light. Anicteric sclerae. Extraocular movements intact. Oropharynx clear without erythema, mass or exudates.  NECK: Supple. No thyromegaly, JVD or carotid bruits. No lymphadenopathy.  LUNGS: Diffuse scattered expiratory wheezing bilaterally with use of accessory muscles on talking. No crackles. No rales heard.   HEART:  S1 and S2 regular rate and rhythm. No murmurs, rubs or  gallops.  ABDOMEN: Soft, nontender and nondistended. No hepatosplenomegaly. Normal bowel sounds.  EXTREMITIES: No pedal edema. No clubbing or cyanosis. 2+ dorsalis pedis pulses palpable bilaterally.  SKIN: No acne, rash or lesions. LYMPH:  No cervical or inguinal lymphadenopathy.  NEUROLOGIC:  Cranial nerves intact. No focal motor or sensory deficits.  PSYCHOLOGICAL: The patient is extremely anxious but alert and oriented x 3.   LABORATORY AND DIAGNOSTICS:  WBC 8.6, hemoglobin 11.5, hematocrit 34.2, platelet count 308.   Sodium 136, potassium 4.6, chloride 103, bicarbonate 27, BUN 12, creatinine 0.74, glucose 106 and calcium 9.0.   ALT 22, AST 32, alk phos 124, total bili 0.4 and albumin 3.9.  Troponins negative. BNP is normal at 51.   Chest x-ray is consistent with COPD and possible acute bronchitis and subsegmental atelectasis cannot be excluded.    ASSESSMENT AND PLAN: A 54 year old female with stage IV emphysema, asthma and anxiety admitted with chronic obstructive pulmonary disease Exacerbation.  1.  Acute on chronic obstructive pulmonary disease exacerbation.  We will admit. IV Solu-Medrol. Oxygen support. She is already on 4 liters home oxygen. Pulmonary consult by Dr. Raul Del recommended. Continue DuoNebs, Advair, Spiriva and also theophylline. We will hold off on antibiotics with no fever and no white count and especially she was recently treated with a full course of Levaquin 2 weeks ago.  2.  Anxiety. One dose of IV Ativan and then Xanax p.r.n.  3.  Depression. Continue trazodone. 4.  Tobacco use disorder. She has been counseled for more than 3 minutes on admission, started on nicotine patch.  5.  Gastrointestinal and deep vein thrombosis prophylaxis. On Protonix and Lovenox.      CODE STATUS: FULL CODE.   TIME SPENT ON ADMISSION: 50 minutes.  ____________________________ Gladstone Lighter, MD rk:sb D: 02/28/2013 14:21:47 ET T: 02/28/2013 14:52:22 ET JOB#: [id number]  cc: Gladstone Lighter, MD, <Dictator> Gladstone Lighter MD ELECTRONICALLY SIGNED 03/02/2013 15:40

## 2015-02-22 NOTE — Discharge Summary (Signed)
PATIENT NAME:  Cassandra Porter, [name] MR#:  [id number] DATE OF BIRTH:  1961-04-24  DATE OF ADMISSION:  07/05/2013 DATE OF DISCHARGE:  07/09/2013  please  cancel my previous discharge summary DISCHARGE DIAGNOSES: 1. Acute on chronic respiratory failure secondary to chronic obstructive pulmonary disease exacerbation. 2. Chronic respiratory failure, on home oxygen 2 liters.  3. Depression.  DISCHARGE MEDICATIONS:  1. ProAir 2 puffs every 4 hours as needed. 2. Fluticasone nasal.  3. Symbicort 1  puff  b.i.d. 4. Trazodone 50 mg p.o. daily. 5. Celexa 20 mg p.o. daily.  6. Dymista 137/50 mcg inhalation daily. 7. BuSpar 10 mg p.o. b.i.d.  8. Prednisone dose tapering was given for her, total 60 mg daily for 2 days' time, 50 mg daily for 2 days, 40 mg daily for 2 days, 30 mg daily for 2 days, 10 mg daily for 2 days. 9. Theophylline 200 mg p.o. daily.   HOSPITAL COURSE: A 54 year old female patient with history of chronic respiratory failure, on home oxygen, congestive heart failure, who came in because of shortness of breath. The patient was given prednisone tapering by Dr. Meredeth IdeFleming, but symptoms did not get better. The patient continued to have shortness of breath, which was worse even with minimal ambulation. The patient initially admitted to hospitalist service, started on Solu-Medrol, nebulizers and DuoNebs, the patient also is on theophylline, so we continued all that. Dr. Meredeth IdeFleming has seen the patient, and the patient was kept in the hospital from September 2nd to September 7th. The patient did not have any pneumonia, but symptoms resolved with IV Solu-Medrol, and discharged home with p.o. prednisone. The patient says that she may have to go on chronic prednisone therapy because after taking prednisone, her symptoms improve.  LABORATORY DATA: CBC and BMP were within normal limits. The patient's chest x-ray also was clear. Troponins have been negative.   DISCHARGE VITAL SIGNS: Temperature 98.2, heart  rate 70, blood pressure 138/88, sats 94% on 2 liters.    DISPOSITION: The patient sent home in stable condition.  DISCHARGE TIME: Took  more than 30 minutes.   ____________________________ Katha HammingSnehalatha Demaris Bousquet, MD sk:OSi D: 07/10/2013 04:54:0922:08:22 ET T: 07/11/2013 06:39:57 ET JOB#: [id number]  cc: Katha HammingSnehalatha Lenya Sterne, MD, <Dictator> Katha HammingSNEHALATHA Karey Suthers MD ELECTRONICALLY SIGNED 07/23/2013 16:16

## 2015-02-22 NOTE — Discharge Summary (Signed)
PATIENT NAME:  Cassandra Porter, Cassandra Porter MR#:  [id number] DATE OF BIRTH:  10-05-1961  DATE OF ADMISSION:  07/05/2013 DATE OF DISCHARGE:  07/09/2013  DISCHARGE DIAGNOSES: 1.  Chronic obstructive pulmonary exacerbation.  2.  Acute on chronic respiratory failure.  3.  History of chronic respiratory failure  duee to copd flare  4.  History of depression.    DISCHARGE MEDICATIONS:   1.  Pantoprazole 40 mg p.o. daily.  2.  ProAir 2 puffs every 4 hours as needed.  3.  Fluticasone /salmetrol  250/50  one puff BID 4.  Albuterol nebulizer every 4 hours. This is also as needed.  5.  Trazodone 5 mg p.o. daily.  6.  Citalopram 20 mg p.o. daily.    CONSULTATIONS: Pulmonary, Dr. Meredeth IdeFleming.      HOSPITAL COURSE:  A 54 year old female with history of chronic respiratory failure, chronic obstructive pulmonary disease on home oxygen 3 liters, came in because of shortness of breath. The patient sats were 94% on 4 liters  with decreased air entry, so we started her on Solu-Medrol along with nebulizers.  The patient was feeling better today and patient continued on her home medications dictation to follow in next page ____________________________ Cassandra HammingSnehalatha Marlaina Coburn, MD sk:cc D: 07/09/2013 22:16:51 ET T: 07/09/2013 23:34:32 ET JOB#: [id number]  cc: Cassandra HammingSnehalatha Mckaylee Dimalanta, MD, <Dictator> Cassandra HammingSNEHALATHA Alexee Delsanto MD ELECTRONICALLY SIGNED 07/23/2013 16:14

## 2015-02-22 NOTE — Discharge Summary (Signed)
PATIENT NAME:  Cassandra Porter, Cassandra Porter MR#:  [id number] DATE OF BIRTH:  1961-04-15  DATE OF ADMISSION:  05/07/2013 DATE OF DISCHARGE:  05/09/2013  ADMITTING PHYSICIAN: Srikar R. Sudini, MD DISCHARGING PHYSICIAN: Enid Baas, MD PRIMARY CARE PHYSICIAN: Courtney Paris. White, FNP, from [facility] in [place]. PRIMARY PULMONOLOGIST: Herbon E. Meredeth Ide, MD  CONSULTATIONS IN THE HOSPITAL: None.   DISCHARGE DIAGNOSES: 1.  Acute hypoxic respiratory failure.  2.  Chronic obstructive pulmonary disease exacerbation.  3.  Chronic respiratory failure, on 4 liters home oxygen.  4.  Tobacco use disorder. 5.  Depression and anxiety.   DISCHARGE HOME MEDICATIONS:  1.  Protonix 40 mg p.o. daily.  2.  ProAir inhaler 2 puffs q.4 hours p.r.n. for wheezing. 3.  Loratadine 10 mg p.o. daily.  4.  Magnesium oxide 250 mg p.o. daily.  5.  Theophylline 200 mg p.o. b.i.d.  6.  Spiriva 18 mcg inhalation capsule daily.  7.  Advair 250/50, 1 puff b.i.d.  8.  DuoNebs with albuterol and Atrovent 3 mL 4 times a day as needed.  9.  Trazodone 50 mg p.o. at bedtime.  10.  Chantix 1 mg p.o. b.i.d.  11.  Lexapro 20 mg p.o. daily.  12.  Levaquin 500 mg p.o. daily for 4 more days.  13.  Prednisone taper over 12 days.  14.  Tussionex 5 mL twice a day as needed for cough.  15.  Tessalon Perles 100 mg p.o. q.6 hours p.r.n. for cough.  HOME OXYGEN: 4 liters chronically.   DISCHARGE DIET: Regular diet.   DISCHARGE ACTIVITY: As tolerated.    FOLLOWUP INSTRUCTIONS: 1.  PCP followup in 3 to 4 weeks.  2.  Follow up with Dr. Meredeth Ide in 2 weeks.  3.  Follow up with [facility] in 2 weeks for Port-A-Cath placement.   LABORATORY AND IMAGING STUDIES PRIOR TO DISCHARGE:  1.  WBC 8.5, hemoglobin 12.0, hematocrit 36.3, platelet count 389.  2.  Sodium 142, potassium 4.2, chloride 106, bicarbonate 31, BUN 7, creatinine 0.89, glucose 165 and calcium of 8.8. 3.  ALT 20, AST 21, alkaline phosphatase 148, total  bilirubin 0.2, albumin of 3.7.  4.  Troponins are negative while in the hospital.  5.  Chest x-ray on admission revealing clear lung fields, no acute cardiopulmonary disease, hyperinflated lungs.  6.  Serum theophylline level is 9.5. The normal range is 10 to 20 mcg/mL.   BRIEF HOSPITAL COURSE: Cassandra Porter is a 54 year old Caucasian female with past medical history significant for chronic respiratory failure secondary to COPD, on 4 liters home oxygen, who follows with Dr. Meredeth Ide as an outpatient, comes to the hospital for COPD exacerbation. This is the patient's one of multiple hospitalizations for the same reason.  1.  Acute hypoxic respiratory failure secondary to COPD exacerbation with bronchitis, on BiPAP in the ED and improved and has been on 4 liters nasal cannula while in the hospital. She was treated with IV steroids, Advair, Spiriva and neb treatment and also on theophylline. Dr. Meredeth Ide was contacted, who will follow up with the patient as an outpatient, and he was trying to do a study infusion drug and a Port-A-Cath will be placed as an outpatient as well for the same. The patient's symptoms have improved and she was insistent on going home today. She is being tapered off the p.o. prednisone, has ambulated on her 4 liters of oxygen with minimal exertional dyspnea, so she is being discharged home. All her home inhalers are being  continued without any changes. She is being discharged on prednisone taper and Levaquin.  2.  Depression and anxiety. The patient is on trazodone and Lexapro for the same.  3.  Tobacco use disorder. Has stopped smoking. Currently on Chantix.   Her course has been otherwise uneventful in the hospital.   DISCHARGE CONDITION: Stable.   DISCHARGE DISPOSITION: Home.   TIME SPENT ON DISCHARGE: 40 minutes.   ____________________________ Enid Baasadhika Emilee Market, MD rk:jm D: 05/09/2013 15:01:44 ET T: 05/09/2013 15:37:19 ET JOB#: [id number]  cc: Enid Baasadhika Edna Rede,  MD, <Dictator> Herbon E. Meredeth IdeFleming, MD Courtney ParisElizabeth B. Cliffton AstersWhite, FNP Enid BaasADHIKA Reynolds Kittel MD ELECTRONICALLY SIGNED 05/29/2013 13:24

## 2015-02-22 NOTE — Consult Note (Signed)
PATIENT NAME:  Cassandra Porter, Cassandra Porter MR#:  [id number] DATE OF BIRTH:  1961-07-02  DATE OF CONSULTATION:  11/27/2012  REFERRING PHYSICIAN:   CONSULTING PHYSICIAN:  Kiearra Oyervides K. Walid Haig, MD  SUBJECTIVE:  The patient was seen in room number 205 as requested to be evaluated for depression.  Staff nurse reports that she has not told her that she was depressed.  The patient was asked if she was depressed and she absolutely stated no, and she said that yesterday she felt like crying and she cried and she stated, "My life is too important for me to get depressed."  The patient is a 54 year old white female not employed and is on disability for COPD and emphysema and bronchial asthma, divorced for 25 years and lives by herself.  The patient [facility] for infection in her lungs and to be treated for the same.    PAST PSYCHIATRIC HISTORY:  No previous history of inpatient psychiatry, no history of suicide attempts, not being followed by any psychiatrist.    ALCOHOL AND DRUGS:  Denies drinking alcohol except drinks 1 beer in 1 week.  Denies any IV drugs, denies smoking nicotine cigarettes.    MENTAL STATUS EXAMINATION:  The patient is dressed in street clothes, alert and oriented to place, person and time, pleasant and cooperative, no agitation.  Affect is bright and cheerful, pleasant and cooperative.  Denies feeling depressed.  Cognition is intact.  Denies feeling hopeless or helpless.  Denies feeling worthless or useless.  No psychosis.  The patient repeatedly stated that her life is too important and beautiful for her to do anything and get depressed, and she is just waiting to get better and be discharged.  Insight and judgment fair and adequate.    IMPRESSION:  No major problems but had to ventilate  her feelings and the patient had a couple of crying spells yesterday to let feelings out.    RECOMMENDATION:  No medications needed at this time. Pt may get help on out pt basis for herself if she needs help to  ventilate feelings and get support in the Community.     ____________________________ Jannet MantisSurya K. Guss Bunde[place]alla, MD skc:cs D: 11/27/2012 20:05:10 ET T: 11/27/2012 20:42:10 ET JOB#: [id number]  cc: Monika SalkSurya K. Guss [place], MD, <Dictator> Beau FannySURYA K Tessa Seaberry MD ELECTRONICALLY SIGNED 12/03/2012 17:53

## 2015-02-22 NOTE — H&P (Signed)
PATIENT NAME:  Cassandra Porter, Cassandra Porter MR#:  [id number] DATE OF BIRTH:  [birth year]  DATE OF ADMISSION:  01/17/2013  PRIMARY CARE PHYSICIAN:  Dr. Cliffton AstersWhite.   REFERRING PHYSICIAN:  Dr. Margarita GrizzleWoodruff.  CHIEF COMPLAINT:  Shortness of breath.   HISTORY OF PRESENT ILLNESS:  The patient is a 54 year old Caucasian female with a past medical history of COPD, chronic respiratory failure, oxygen dependent, recently increased to 4 liters, insomnia, chronic smoking is presenting to the ER with a chief complaint of shortness of breath.  The patient was admitted to the hospital with acute exacerbation of COPD in the last 6 months several times.  She is reporting that she is getting stressed out which is triggering her COPD exacerbation.  Still smoking, but cut down to only one cigarette per day.  Recently her home oxygen was increased from 3 liters to 4 liters by nasal cannula.  She has been experiencing shortness of breath associated with coughing and wheezing for the past two days which has been worse today.  The patient could not breathe and feeling tight in her chest which made her come to the ER.  In the Emergency Room, patient was extremely hypoxic sating 84% on 10 liters of oxygen.  ER physician tried to put her on BiPAP, but patient refused BiPAP as she is claustrophobic and feeling uncomfortable.  Also, she states that she did not want to be intubated or put her on a breathing machine if needed.  The patient was placed on a partial rebreather and she was given Solu-Medrol 125 mg IV.  Continued breathing treatments were provided and magnesium was given in the IV form.  Chest x-ray has revealed poor penetration, but possible developing right lower lobe infiltrate.  Initially her breathing rate was at 14, but during my examination she was breathing at 18 to 20 per minute.  She is reporting that she smokes one cigarette if she is getting stressed out as she has to take care of 55 year old sister's mother-in-law.  No other  complaints.  Denies any chest pain.  No abdominal pain, nausea, vomiting, diarrhea.  Denies any fevers.  No sick contacts.   PAST MEDICAL HISTORY:  COPD, chronic respiratory failure, oxygen dependent, tobacco abuse, insomnia, GERD.   PAST SURGICAL HISTORY:  Denies any surgeries in the past.   ALLERGIES:  To IODINATED RADIOCONTRAST DYES.   HOME MEDICATIONS:  Trazodone 50 mg every other night as needed for insomnia, theophylline 200 mg 1 tablet 2 times a day, Spiriva 18 mcg 1 capsule inhalation once a day, Protonix 40 mg once a day, prednisone 10 mg tapering dose, mucus relief 1 tablet 2 times a day, magnesium oxide 1 tablet by mouth once daily, dose needs to be clarified.  Loratadine 10 mg once daily, DuoNebs 3 mL inhalation every 4 hours as needed for shortness of breath, Ceftin 500 mg 1 tablet 2 times a day, budesonide 1 puff inhalation 2 times a day, azithromycin 500 mg once a day for 4 days and she finished the antibiotic course of azithromycin, Aleve 220 mg 1 tablet 2 times a day, Advair 250 mcg 1 puff inhalation 2 times a day.   PSYCHOSOCIAL HISTORY:  Lives at home with the 55 year old sister's mother-in-law and needs to take care of her which is stressful to her.  She smokes 1 cigarette per day if she is stressed out.  Denies any alcohol intake or street drugs.   FAMILY HISTORY:  Father and grandmother had a history of COPD.   REVIEW  OF SYSTEMS:  CONSTITUTIONAL:  Denies any fever, but complaining of fatigue and weakness.  Denies any pain, weight loss or weight gain.  EYES:  The patient denies any blurry vision.  No glaucoma or cataracts.  EARS, NOSE, THROAT:  No tinnitus, epistaxis, discharge, snoring, postnasal drip or sinus pain.  RESPIRATIONS:  Complaining of cough and wheezing.  Positive history of COPD, has chronic respiratory failure and lives on 4 liters of oxygen.  Denies any pneumonia, tuberculosis.  CARDIOVASCULAR:  No chest pain, palpitations, syncope, varicose veins.   GASTROINTESTINAL:  No nausea, vomiting, diarrhea or abdominal pain.  Has the history of GERD.  No rectal bleeding, constipation.  GENITOURINARY:  No dysuria or hematuria.  GYNECOLOGIC AND BREAST:  No breast masses or vaginal discharge.  ENDOCRINE:  No polyuria, nocturia, thyroid problems, polydipsia.  HEMATOLOGY AND LYMPHATIC:  No anemia, easy bruising or bleeding.  INTEGUMENTARY:  No acne, rash, lesions.  MUSCULOSKELETAL:  No joint pain in the neck, back, shoulder.  Denies any history of gout, arthritis. NEUROLOGIC:  No vertigo, ataxia, dementia, dysarthria or weakness.  PSYCHIATRIC:  Has positive history of insomnia.  Denies any ADD or OCD.  Denies bipolar disorder.   PHYSICAL EXAMINATION: VITAL SIGNS:  Temperature 98.1, pulse 100, respirations 20, blood pressure 104/60, pulse oximetry 96% on 3.5 liters of partial rebreather.  GENERAL APPEARANCE:  Not under acute distress.  Moderately built and moderately nourished.  HEENT:  Normocephalic, atraumatic.  Pupils are equally reacting to light and accommodation.  No scleral icterus.  No conjunctival injection.  Extraocular movements are intact.  No sinus tenderness.  No postnasal drip.  No nasal discharge.  Moist mucous membranes.  Oral cavity and dentition are intact.  Tympanic membranes are intact.  NECK:  Supple.  No JVD.  No thyromegaly.  No lymphadenopathy.  LUNGS:  Moderate air entry.  Minimal wheezing is present diffusely.  No accessory muscle usage.  No anterior chest wall tenderness.  No crackles.  CARDIOVACULAR:  S1, S2 normal, tachycardic.  Regular rate and rhythm.  No murmurs.  No peripheral edema.  GASTROINTESTINAL:  Soft.  Bowel sounds are positive in all four quadrants.  Nontender, nondistended.  No masses felt.  No hepatosplenomegaly.  No CVA tenderness.   NEUROLOGIC:  Awake, alert, oriented x 3.  Motor and sensory are grossly intact.  Cranial nerves II through XII are grossly intact.  Reflexes are 2+.  SKIN:  With no rashes,  lesions.  Normal turgor.  Warm to touch.  MUSCULOSKELETAL:  No joint effusion, tenderness, erythema.  No gouty lesions.  PSYCHIATRIC:  Normal mood and affect.   LABORATORY AND IMAGING STUDIES:  A 12-lead EKG has revealed sinus tachycardia at 105 beats per minute.  Normal PR interval, bilateral atrial enlargement is present.  Chest x-ray, possible early developing infiltrate in the right lower lobe with poor penetration from poor inspiratory effort.  BNP 57, glucose 109, BUN 10, creatinine 0.66, sodium 139, potassium 4.2, chloride 104, CO2 27, GFR greater than 60, anion gap is 8, osmolality 277, calcium 9.1.  LFTs are normal.  CK total 58.  CPK-MB 0.6, troponin less than 0.02.  WBC 10.4, hemoglobin 11.9, hematocrit 36.5, platelets 367, MCV 91.  ABG, pH 7.39, pCO2 45, pO2 98, FiO2 36%, bicarb is 27.2, sating 95.6% on 4 liters of mask.   ASSESSMENT AND PLAN:  This is a 54 year old female with a chronic history of chronic obstructive pulmonary disease, chronic respiratory failure, lives on 4 liters of oxygen with several admissions in  the past 6 months is presenting with shortness of breath, chest tightness and cough for two days, will be admitted with the following assessment and plan.  1.  Acute exacerbation of chronic obstructive pulmonary disease.  The patient is currently on a partial rebreather.  We will continue the same as she is refusing BiPAP.  Solu-Medrol 125 mg and magnesium was given in the ER.  We will continue Solu-Medrol 60 mg IV q. 6 hours, DuoNeb neb treatments and albuterol as needed basis.  The patient will be given IV levofloxacin for possible developing right lower lobe infiltrate.  We will obtain sputum culture and sensitivity.  We will continue her home medication theophylline.   2.  Gastroesophageal reflux disease.  We will continue proton pump inhibitor.  3.  Insomnia.  We will continue her home medication trazodone.  4.  Nicotine dependence.  The patient was counseled to quit smoking  and we will provide her nicotine patch.  5.  We will provide her gastrointestinal as well as deep vein thrombosis prophylaxis.   6.  CODE STATUS:  THE PATIENT IS LIMITED CODE.  She states that she does not need to be intubated, but okay with CPR.  On further discussion she broke into tears and I was unable to continue my discussion regarding the CODE STATUS and this needs to be further discussed by the rounding physician in a.m.  The patient's power of attorney is her sister.    TOTAL TIME SPENT ON THE ADMISSION:  Is 50 minutes.  The plan of care was discussed with the patient.  She is aware of the plan.     ____________________________ Ramonita Lab, MD ag:ea D: 01/18/2013 00:16:47 ET T: 01/18/2013 02:00:24 ET JOB#: [id number]  cc: Ramonita Lab, MD, <Dictator> Ramonita Lab MD ELECTRONICALLY SIGNED 01/21/2013 1:40

## 2015-02-22 NOTE — Discharge Summary (Signed)
PATIENT NAME:  Cassandra Porter, [name]#:  [id number] DATE OF BIRTH:  04/24/1961  DATE OF ADMISSION:  01/17/2013 DATE OF DISCHARGE:  01/19/2013  For a detailed note, please take a look at the history and physical done on admission by Dr. Amado CoeGouru.   DIAGNOSES AT DISCHARGE: Chronic obstructive pulmonary disease exacerbation. Insomnia. Gastroesophageal reflux disease.   DIET: The patient is being discharged on a regular diet.   ACTIVITY: As tolerated.   FOLLOWUPDoristine Mango: Elizabeth White in the next 1 to 2 weeks.   DISCHARGE MEDICATIONS: DuoNebs q.4 hours as needed, theophylline 200 mg b.i.d., Spiriva 1 puff daily, Protonix 40 mg daily, azelastine nasal spray b.i.d., trazodone 50 mg at bedtime, magnesium oxide 250 mg daily, loratadine 10 mg daily, Aleve 220 mg b.i.d., Advair 250/50 one puff b.i.d., budesonide 1 puff b.i.d., Levaquin 5 mg daily x 5 days and prednisone taper starting at 50 mg down to 10 mg over the next 5 days.   PERTINENT STUDIES DONE DURING THE HOSPITAL COURSE: Chest x-ray done on admission showing no acute cardiopulmonary disease of the chest.   BRIEF HOSPITAL COURSE: This is a 54 year old female with medical problems as mentioned above, presented to the hospital with shortness of breath and cough and likely COPD exacerbation.  1.  COPD exacerbation: The patient presented with worsening shortness of breath and cough. Her chest x-ray did not show any evidence of pneumonia. Most likely cause of her COPD exacerbation was probably a bronchitis with possible concomitant ongoing tobacco abuse. The patient was treated with IV steroids, around-the-clock nebulizer treatments, empiric antibiotics. The patient's clinical symptoms after aggressive therapy have significantly improved. She is already on home oxygen, which she will continue. She is being taken off the IV steroids, put on a prednisone taper along with empiric p.o. Levaquin and being discharged home. The patient is also to continue her home  medications including theophylline and Advair and Spiriva for her COPD.  2.  GERD: The patient was maintained on her Protonix. She will resume that. 3.  Insomnia: The patient is on trazodone. She will continue that.   CODE STATUS: The patient is a full code.   TIME SPENT ON DISCHARGE: 35 minutes.    ____________________________ Rolly PancakeVivek J. Cherlynn KaiserSainani, MD vjs:jm D: 01/19/2013 15:56:20 ET T: 01/19/2013 16:22:26 ET JOB#: [id number]  cc: Rolly PancakeVivek J. Cherlynn KaiserSainani, MD, <Dictator> Courtney ParisElizabeth B. Cliffton AstersWhite, NP Houston SirenVIVEK J Turrell Severt MD ELECTRONICALLY SIGNED 01/29/2013 12:30

## 2015-02-22 NOTE — H&P (Signed)
PATIENT NAME:  Cassandra Porter, Cassandra Porter MR#:  [id number] DATE OF BIRTH:  1961/09/17  DATE OF ADMISSION:  07/04/2013  REFERRING PHYSICIAN:  Dr. Marjean Donna   PRIMARY CARE PHYSICIAN: Eulogio Bear, NP with [facility].   PRIMARY PULMONARY: Dr. Raul Del.   CHIEF COMPLAINT: Shortness of breath.   HISTORY OF PRESENT ILLNESS: This is a 54 year old female with a history of COPD and  chronic respiratory failure on 4 liters of oxygen at home at baseline, with recurrent presentation to [facility] on admission due to COPD exacerbation. The patient reports her symptoms has been going on for 2 weeks. She complains of shortness of breath, wheezing and a cough, but denies any phlegm, any productive sputum, she was seen by her pulmonary recently who started her on p.o. prednisone tapering dose which she has been taking for the last few days without much improvement of her symptoms, which prompted her to come to ED. In the ED,  the patient was found to be extremely short of breath. She had diffuse significant decrease air entry with wheezing, even though by her ABG was not that impressive, she received multiple nebulizer treatments in the ED and, despite that, clinically, she had decreased air entry bilaterally with significant wheezing, so hospitalist service were requested to admit the patient for further treatment of her COPD exacerbation. The patient denies any chest pain, fever, chills, lightheadedness,  vomiting or nausea. Her chest x-ray does not show any opacity.   PAST MEDICAL HISTORY: 1.  COPD.   2.  Recurrent pneumonias. 3.  Chronic respiratory failure on 4 liters oxygen.  4.  Depression.  5.  History of tobacco abuse.   PAST SURGICAL HISTORY: 1.  C-section.  2.  Right knee meniscal repair.   ALLERGIES: IODINE.    HOME MEDICATIONS: 1.  Trazodone 50 mg at bedtime.  2.  Theophylline 200 mg p.o. b.i.d.  3.  Advair 250/50 1 puff b.i.d.  4.  Prednisone tapering dose.  5.  DuoNeb every 4  hours as needed.  6.  Magnesium oxide 250 oral daily.  7.  Protonix 40 mg oral daily.  8.  Spiriva inhaled capsule daily.   SOCIAL HISTORY: The patient lives at home. She did quit smoking in April, but currently is smoking again 1/2 pack to 1 pack of cigarettes daily. Drinks alcohol occasionally. No illicit drug use.   FAMILY HISTORY: Significant for COPD in her brother and her father.   REVIEW OF SYSTEMS: CONSTITUTIONAL: The patient denies fever, chills, weight gain, weight loss. Complains of weakness and fatigue.  EYES: Denies blurry vision, double vision, inflammation, glaucoma.  ENT: Denies symptoms of chest pain, hearing loss, epistaxis or discharge.  RESPIRATORY: Complains of cough, but no productive sputum. Has wheezing, shortness of breath and COPD.  CARDIOVASCULAR: Denies chest pain, orthopnea, edema, palpitations, syncope.  GASTROINTESTINAL: Denies nausea, vomiting, diarrhea, abdominal pain, hematemesis, rectal bleed.  GENITOURINARY: Denies dysuria, hematuria, or renal colic.  ENDOCRINE: Denies polyuria, polydipsia, heat or cold intolerance.  HEMATOLOGY: Denies anemia, easy bruising or bleeding diathesis.  INTEGUMENT: Denies acne, rash or skin lesions.  MUSCULOSKELETAL: Denies neck pain, shoulder pain, arthritis, cramps or gout.  NEUROLOGICAL: Denies CVA, TIA, ataxia, dementia, headache.  PSYCHIATRIC: Denies anxiety, insomnia or schizophrenia.   PHYSICAL EXAMINATION: VITAL SIGNS: Temperature 99.2, pulse 98, respiratory rate 32, blood pressure 111/58, saturating 94% on 4 liters nasal cannula.   GENERAL: Elderly female who looks comfortable in bed, in no apparent distress.  HEENT: Head is atraumatic, normocephalic. Pupils equal, reactive  to light. Pink conjunctivae. Anicteric sclerae. Moist oral mucosa.  NECK: Supple. No thyromegaly. No JVD.  CHEST: Had significant decreased air entry bilaterally with scattered wheezing. No rales or rhonchi.  CARDIOVASCULAR: S1, S2 heard. No  rubs, murmurs or gallops.  ABDOMEN: Soft, nontender, nondistended. Bowel sounds present.  EXTREMITIES: No edema, clubbing. No cyanosis, dorsalis pedis pulse +2 bilaterally.  SKIN: Normal skin turgor. Warm and dry.  PSYCHIATRIC: Appropriate affect. Awake, alert x 3. Intact judgment and insight.  NEUROLOGIC: Cranial nerves grossly intact. Motor 5 out of 5. No focal deficits.  LYMPHATICS: No cervical, supraclavicular lymphadenopathy.   PERTINENT LABORATORY DATA: Glucose 105, BUN 9, creatinine 0.93, sodium 137, potassium 3.7, chloride 104, CO2 29, ALT 17, AST 21, alk phos 130. Total CK 46. Troponin less than 0.02. White blood cells 9.8, hemoglobin 11.9, hematocrit 35.3, platelets 414. EKG showing normal sinus rhythm at 103 beats per minute.  Chest x-ray:  There is no acute opacity or infiltrate could be appreciated on the chest x-ray.   ASSESSMENT AND PLAN: 1.  Chronic obstructive pulmonary disease exacerbation, even though the patient's ABG is not significant for hypercarbia, but the patient has a clinical decreased air entry with wheezing, so she will be admitted. She will be on IV Solu-Medrol 60 every 6 hours. Will continue her on DuoNebs, as we will continue her on her home medications, including Advair and Symbicort. Given the fact she does not have any productive sputum, we will hold on starting her on IV antibiotics.  2.  History of chronic respiratory failure: Will continue her on 4 liters nasal cannula.  3.  History of depression. Continue with home meds.  4.  Tobacco abuse: The patient was counseled. She will be started on NicoDerm patch.  5.  Deep vein thrombosis prophylaxis. Subcutaneous heparin. 6.  Gastrointestinal prophylaxis.  On Protonix, especially she is on IV Solu-Medrol.  7.  CODE STATUS: Full code.   Total Time Spent on admission and patient care: 45 minutes.    ____________________________ Albertine Patricia, MD dse:nts D: 07/05/2013 03:02:02 ET T: 07/05/2013 03:22:55  ET JOB#: [id number]  cc: Albertine Patricia, MD, <Dictator> Shaylah Mcghie Graciela Husbands MD ELECTRONICALLY SIGNED 07/14/2013 3:05

## 2015-02-24 NOTE — H&P (Signed)
PATIENT NAME:  Cassandra Porter, Cassandra Porter MR#:  [id number] DATE OF BIRTH:  Jul 30, 1961  DATE OF ADMISSION:  05/01/2012  PRIMARY CARE PHYSICIAN: She has no doctor.   CHIEF COMPLAINT: Increased shortness of breath, cough, and wheezing x2 to 3 days.   HISTORY OF PRESENT ILLNESS: Cassandra Porter is a 54 year old Caucasian female with history of chronic obstructive pulmonary disease and ongoing tobacco abuse. She was in her usual state of health stating that lately she ran out of her medications. She has no doctor that she follow with and over the last three days she developed increased shortness of breath, wheezing and cough. Her symptoms worsened and then she decided to come to the Emergency Department. Evaluation here is consistent with acute exacerbation of chronic obstructive pulmonary disease. The patient received treatment with moderate improvement. Her chest x-ray is negative for pneumonia. The patient was admitted for further treatment of her chronic obstructive pulmonary disease exacerbation.   REVIEW OF SYSTEMS: CONSTITUTIONAL: Denies any fever. No chills. No fatigue. EYES: No blurring of vision. No double vision. ENT: No hearing impairment. No sore throat. No dysphagia. CARDIOVASCULAR: No chest pain, but reports shortness of breath and wheezing. No syncope. RESPIRATORY: Reports cough, wheezing and shortness of breath. No chest pain. GASTROINTESTINAL: No abdominal pain. No vomiting, no diarrhea. GENITOURINARY: No dysuria. No frequency of urination. MUSCULOSKELETAL: No joint swelling or pain. No muscular pain or swelling. INTEGUMENTARY: No skin rash. No ulcers. NEUROLOGY: No focal weakness. No seizure activity. No headache. PSYCHIATRY: No anxiety or depression. ENDOCRINE: No polyuria or polydipsia. No heat or cold intolerance.   PAST MEDICAL HISTORY: Chronic obstructive pulmonary disease and tobacco abuse.   SOCIAL HABITS: Chronic smoker. She started smoking at the age of 54. The peak of her smoking reached 2  packs a day, but lately she cut down to about 2 to 3 cigarettes a day. She drinks 2 to 3 beers of Budweiser, but not daily.   SOCIAL HISTORY: She is divorced and lives at home alone. She is unemployed.   FAMILY HISTORY: Her mother is alive and healthy. Her father died at the age of 55 from complications of chronic obstructive pulmonary disease and he was a smoker.   ADMISSION MEDICATIONS: None. She is to have inhaler, one of them was Spiriva.   ALLERGIES: No known drug allergies.   PHYSICAL EXAMINATION:  VITAL SIGNS: Blood pressure 96/54, respiratory rate 22, pulse 90, temperature 97.6, oxygen saturation 95%.   GENERAL APPEARANCE: Middle-aged female lying in bed in no acute distress.   HEAD AND NECK EXAMINATION: No pallor. No icterus. No cyanosis.   ENT: Hearing was normal. Nasal mucosa, lips, tongue were normal.   EYES: Normal eyelids and conjunctiva. Pupils about 6 mm, equal and reactive to light.   NECK: Supple. Trachea at midline. No thyromegaly. No cervical lymphadenopathy. No masses.   HEART: Normal S1, S2. No S3, S4. No murmur. No gallop. No carotid bruits.   RESPIRATORY: Normal breathing pattern at the time of my examination. There is a prolonged expiratory phase and bilateral expiratory wheezing. No rales.   ABDOMEN: Soft without tenderness. No hepatosplenomegaly. No masses. No hernias.   SKIN: No ulcers. No subcutaneous nodules.   MUSCULOSKELETAL: No joint swelling. No clubbing.   NEUROLOGIC: Cranial nerves II through XII are intact. No focal motor deficit.   PSYCHIATRY: The patient is alert and oriented x3. Mood and affect were normal.   LABORATORY, DIAGNOSTIC, AND RADIOLOGICAL DATA: Chest x-ray showed no consolidation, no effusion. Heart size was normal. EKG  showed sinus tachycardia at rate of 105 per minute. Nonspecific T wave abnormalities, otherwise unremarkable EKG. Her blood work-up showed B-type natriuretic peptide 128, glucose 130, BUN 3, creatinine 0.8, sodium  140, potassium 3.4. Liver function tests were normal. CPK 61. Troponin less than 0.02. CBC showed white count of 8,000, hemoglobin 13, hematocrit 39, platelet count 326. Prothrombin time 12. INR 0.9.   ASSESSMENT:  1. Acute exacerbation of chronic obstructive pulmonary disease.  2. Noncompliance.  3. Tobacco abuse.  4. Mild hypokalemia.   PLAN:  1. Admit to the medical floor.  2. Oxygen supplementation, DuoNeb, inhalation therapy.  3. Small dose of IV Solu-Medrol.  4. Spiriva one inhalation once a day.  5. Rocephin 1 gram daily.  6. The patient needs to quit smoking.  7. I will give the patient one dose of potassium chloride 20 mEq to correct the hypokalemia.   TIME SPENT IN EVALUATING THIS PATIENT: More than 55 minutes.   ____________________________ Carney Corners. Rudene Re, MD amd:ap D: 05/01/2012 01:46:51 ET T: 05/01/2012 10:04:56 ET JOB#: [id number]  cc: Carney Corners. Rudene Re, MD, <Dictator> Zollie Scale MD ELECTRONICALLY SIGNED 05/01/2012 22:22

## 2015-02-24 NOTE — H&P (Signed)
PATIENT NAME:  Cassandra Porter, Cassandra Porter MR#:  [id number] DATE OF BIRTH:  1961-01-20  DATE OF ADMISSION:  02/05/2012  CHIEF COMPLAINT: Shortness of breath and cough.  HISTORY OF PRESENT ILLNESS: Ms. Cassandra Porter is a 54 year old female with significant past medical history of asthma, also antitrypsin deficiency and chronic obstructive pulmonary disease, emphysema stage IV as per patient presents with worsening shortness of breath. Patient reports she has history of chronic obstructive pulmonary disease diagnosed two years ago. She is on home oxygen at nighttime and during daytime mainly during exertion and she reports she does not have any insurance and she ran out of all of her medications and she is not taking any medications for her chronic obstructive pulmonary disease. Patient reports she has been having chest tightness, worsening shortness of breath, fever, chills and cough with minimally productive yellow sputum over the last couple of days. Patient denies any palpitations, chest pain, sweating, nausea or vomiting. Patient was given 125 mg of IV Solu-Medrol by paramedics and upon presentation to ED patient was started on oxygen via nasal cannula. Currently she reports her symptoms have been improving since she had two treatments of DuoNebs. Patient's chest x-ray does not show any evidence of infiltrate.   PAST MEDICAL HISTORY: 1. Asthma. 2. Antitrypsin deficiency.  3. Chronic obstructive pulmonary disease, emphysema, stage IV.   ALLERGIES: No known drug allergies.   MEDICATIONS: Does not take any home medications.   SOCIAL HISTORY: Patient lives with her sister. Recently moved from [place] in July of last year. Currently unemployed, on disability. Patient has history of smoking up to four packs per day but recently cut back where she is currently smoking where a pack lasts her four to five days. Drinks two beers per night. No history of illicit drug use.   FAMILY HISTORY: History of diabetes on her  father's side.   REVIEW OF SYSTEMS: CONSTITUTIONAL: Complains of fever, fatigue, weakness. EYES: Denies any blurred vision, double vision or pain. ENT: Denies any tinnitus, ear pain, hearing loss, epistaxis. RESPIRATORY: Complains of cough. Complains of wheezing, shortness of breath progressive, mainly on exertion. Yellow productive sputum but minimal. CARDIOVASCULAR: No chest pain. No orthopnea. No edema. No palpitations. GASTROINTESTINAL: No nausea, vomiting, diarrhea, abdominal pain. GENITOURINARY: No dysuria, hematuria, frequency or incontinence. ENDOCRINE: Denies any polyuria, polydipsia, heat or cold intolerance. HEMATOLOGY: Denies any history of easy bruising, bleeding diathesis or blood clots. NEUROLOGICAL: Denies any numbness, weakness, dysarthria, epilepsy or tremors. PSYCH: Denies any insomnia, drug abuse.   PHYSICAL EXAMINATION: VITAL SIGNS: Pulse 94, respiratory rate 38, blood pressure 119/78, saturating 97% on oxygen via nasal cannula.   GENERAL: Well nourished female, comfortable in bed, no apparent distress.   HEENT: Head atraumatic, normocephalic. Pupils reactive to light. Clear conjunctiva. Anicteric sclera. Moist oral mucosa.   NECK: Supple. No thyromegaly. No JVD.   CHEST: Patient had fair air entry bilaterally with scattered wheezing. No rhonchi.   CARDIOVASCULAR: S1, S2 heard. No rubs, murmurs, gallops.   ABDOMEN: Soft, nontender, nondistended. Bowel sounds present.   EXTREMITIES: No edema. No clubbing. No cyanosis.   SKIN: No rash.   MUSCULOSKELETAL: Moves all extremities, 5/5 motor strength.  NEUROLOGICAL: Cranial nerves grossly intact.  PSYCH: Appropriate affect. Awake, alert x3. Intact judgment and insight.   LABORATORY, DIAGNOSTIC AND RADIOLOGICAL DATA: Glucose 104, BUN 10, creatinine 0.72, sodium 140, potassium 3.8, chloride 106, carbon dioxide 23, troponin less than 0.02, white blood cell count 10.4, hemoglobin 13.5, hematocrit 40.1, platelets 346.   Chest  x-ray: No evidence  of infiltrate. Hyperinflated lungs with picture of chronic obstructive pulmonary disease.   ASSESSMENT AND PLAN:  1. Acute on chronic respiratory failure. This is secondary to chronic obstructive pulmonary disease exacerbation; chronic obstructive pulmonary disease exacerbation. Patient will be started on IV Solu-Medrol 60 q.8 hours and will be tapered according to her symptoms and will start on IV Rocephin and Zithromax. Will start on DuoNebs q.4 hours and albuterol as needed and will have her on oxygen with target goal of 93% saturation.  2. Tobacco abuse. Will start patient on nicotine patch and was counseled to stop smoking.  3. Will start patient on Protonix for GI prophylaxis as she is on large dose Solu-Medrol and will start patient on sub-Q heparin for DVT prophylaxis.  4. CODE STATUS: Patient is FULL CODE.   ____________________________ Starleen Arms, MD dse:cms D: 02/05/2012 02:20:53 ET T: 02/05/2012 07:17:45 ET JOB#: [id number]  cc: Starleen Arms, MD, <Dictator> Liisa Picone Teena Irani MD ELECTRONICALLY SIGNED 02/13/2012 1:24

## 2015-02-24 NOTE — Discharge Summary (Signed)
PATIENT NAME:  Cassandra Porter, [name] MR#:  [id number] DATE OF BIRTH:  1961/02/04  DATE OF ADMISSION:  05/01/2012 DATE OF DISCHARGE:  05/02/2012  PRESENTING COMPLAINT: Shortness of breath.   DISCHARGE DIAGNOSES:  1. Acute chronic obstructive pulmonary disease exacerbation.  2. Ongoing tobacco abuse.   CONDITION ON DISCHARGE: Fair. Sats are 90 to 92% on 2 liters nasal cannula oxygen.   DISCHARGE MEDICATIONS:  1. Spiriva 18 mcg inhalation daily.  2. Oxygen 2 liters per minute through nasal cannula.  3. Symbicort 160/4.5 1 puff b.i.d.  4. Prednisone taper.  5. DuoNebs 3 mL q.6 p.r.n.   DIET: Regular.   DISCHARGE INSTRUCTIONS: The patient was advised to stop smoking.   LABORATORY, DIAGNOSTIC, AND RADIOLOGICAL DATA: Chest x-ray no acute cardiopulmonary abnormality.   B-type Natriuretic Peptide 128. Cardiac enzymes negative. CBC within normal limits. Comprehensive metabolic panel within normal limits.   BRIEF SUMMARY OF HOSPITAL COURSE: [name] is a patient known to us from previous admissions who came in with longstanding history of tobacco abuse and COPD who came into the hospital with shortness of breath and cough. She was admitted with:  1. Chronic obstructive pulmonary disease exacerbation. She was started on IV steroids, nebulizers, and inhalers. She was changed to p.o. steroid taper. No indication for antibiotic use. The patient has nonproductive cough, appears more likely smoker's cough. Chest x-ray is negative for pneumonia. She is already on home oxygen and she will continue Symbicort, Spiriva, and SVNs p.r.n.  2. Tobacco abuse. The patient is advised to stop smoking.   FOLLOW-UP: She is set up with [facility]. She has an appointment on July 25th at 6:30 p.m.   TIME SPENT: 40 minutes.  ____________________________ Wylie HailSona A. Allena KatzPatel, MD sap:drc D: 05/02/2012 14:03:47 ET T: 05/02/2012 17:07:54 ET JOB#: [id number]  cc: Lonette Stevison A. Allena KatzPatel, MD, <Dictator> [facility] Willow OraSONA A  Rashawd Laskaris MD ELECTRONICALLY SIGNED 05/05/2012 14:10

## 2015-02-24 NOTE — Discharge Summary (Signed)
PATIENT NAME:  Cassandra Porter, Cassandra Porter MR#:  [id number] DATE OF BIRTH:  [birth year]  DATE OF ADMISSION:  02/05/2012 DATE OF DISCHARGE:  02/06/2012  ADMITTING DIAGNOSIS: Acute on chronic respiratory failure.   DISCHARGE DIAGNOSES:  1. Chronic respiratory failure, oxygen requiring. 2. Chronic obstructive pulmonary disease exacerbation. 3. Acute bronchitis.  4. Ongoing tobacco abuse.  5. Hyperglycemia due to steroids.  DISCHARGE CONDITION: Stable.   DISCHARGE MEDICATIONS:  1. Prednisone 60 mg p.o. once on 02/07/2012 then taper x 10 mg every two days until stopped. 2. Zithromax 500 mg p.o. daily for four more days.  3. Nicotine patch 21 mg topically daily.  4. Nicotine oral inhaler one cartridge every one hour as needed.  5. Symbicort 160/4.5 two puff inhalation every 12 hours.  6. Tiotropium 1 capsule inhalation daily.  7. Mucinex 600 mg p.o. twice daily.  8. Robitussin-AC 10 mL every four hours as needed.   HOME OXYGEN: 3 liters of oxygen through nasal cannula on exertion and 2.5 liters at rest and also 3 liters on exertion as well as with sleep.  DIET: Regular.   PHYSICAL ACTIVITY LIMITATIONS: As tolerated.  DISCHARGE FOLLOWUP: Followup with the [facility] on 02/23/2012 at 6:00 p.m.   CONSULTANTS: None.  RADIOLOGIC STUDIES: Chest x-ray, portable single view, on 02/05/2012, showed hyperinflation however no evidence of acute cardiopulmonary disease.   HISTORY OF PRESENT ILLNESS: The patient is a 54 year old Caucasian female with past medical history significant for history of chronic obstructive pulmonary disease and ongoing tobacco abuse who presented to hospital with complaints of shortness of breath as well as cough. Please refer to Dr. Mliss Fritzawood Elgergawy's admission note on 02/05/2012.   On arrival to the hospital, the patient's pulse was 94, respirations were 38, blood pressure 119/78, and saturation was 97% on oxygen. Physical exam revealed scattered wheezing bilaterally and  shortness of breath especially whenever she coughed. Otherwise physical examination was unremarkable.   LABS/STUDIES: On admission, on 02/05/2012, revealed normal BMP except of high glucose 104. The patient's liver enzymes were normal. Cardiac enzymes, first set, was negative. CBC was within normal limits.   EKG showed sinus tachycardia at 101 beats per minute, normal axis, and nonspecific ST-T wave abnormalities were noted.   Chest x-ray showed hyperinflation, as mentioned above.  HOSPITAL COURSE: The patient was admitted to the hospital. She was started on steroids, inhalation therapy, oxygen, and antibiotics. She improved significantly over the next 24 hours. On 02/06/2012 she felt satisfactory, much more comfortable, and wanted to go home.   In regards to acute on chronic respiratory failure, the patient now is oxygen requiring for her chronic obstructive pulmonary disease and emphysema. She is to continue prednisone as well as Zithromax and Symbicort as well as tiotropium and cough medications, which is Mucinex as well as Robitussin. She is to follow up with the [facility] in the next few days after discharge to ensure that she is improving adequately. Oxygen requirement was checked while she was in the hospital. She was noted to have need for 2.5 liters of oxygen through nasal cannula at rest. However, she was desaturating and required at least 3 liters of oxygen through nasal cannula on exertion as well as with sleep. She will be given oxygen therapy upon discharge. Social Work was consulted for discharge management. It was felt that the patient's acute on chronic respiratory failure was very likely secondary to chronic obstructive pulmonary disease exacerbation, acute bronchitis related. The patient was counseled about tobacco abuse issues.  She  was also given a nicotine patch as well as nicotine oral inhaler to help her to quit smoking.   The patient was noted to have mild hyperglycemia  while she was managed on steroids. Her labs were rechecked on 02/06/2012 and BMP was within normal limits. The patient's CBC showed mild hemoglobin dropped from 13.5 on day of admission to 11.7 on the day of discharge, but no active bleeding was noted. Her vital signs were stable with temperature 97.5, pulse 79, respiration rate 18, blood pressure 126/76, and saturation 94% on 3 liters of oxygen through nasal cannula at rest and 90% on exertion, on the same 3 liters of oxygen through nasal cannula.   The patient was advised to follow-up as soon as possible with the [facility] and make decisions about management of her chronic lung condition, chronic obstructive pulmonary disease. She was advised to have pulmonary function tests checked in the next few weeks after discharge provided her respiratory symptoms subside. The patient had attempted sputum cultures, however, she was not able to expectorate much during her stay in the hospital time. As her symptoms improved on current management, it was felt that she is safe to be discharged on antibiotics as well as steroids. She is however to follow up, as mentioned above, with the [facility] is to establish primary care.   TIME SPENT: 40 minutes.  ____________________________ Katharina Caper, MD rv:slb D: 02/06/2012 17:09:34 ET T: 02/08/2012 14:02:25 ET JOB#: [id number]  cc: Katharina Caper, MD, <Dictator> [facility] Heavin Sebree MD ELECTRONICALLY SIGNED 02/23/2012 20:02

## 2015-03-17 IMAGING — CR DG CHEST 2V
1 series · 2 of 2 positions shown · non-contrast
Comparison: none

REASON FOR EXAM: SOB
COMMENTS:

PROCEDURE:     DXR - DXR CHEST PA (OR AP) AND LATERAL  - February 06, 2013  [DATE]
RESULT:     Comparison: 02/05/2013

[Series 1: w chest pa · 0.14mm/px · 2 of 2 slices shown]
[im 1/2]
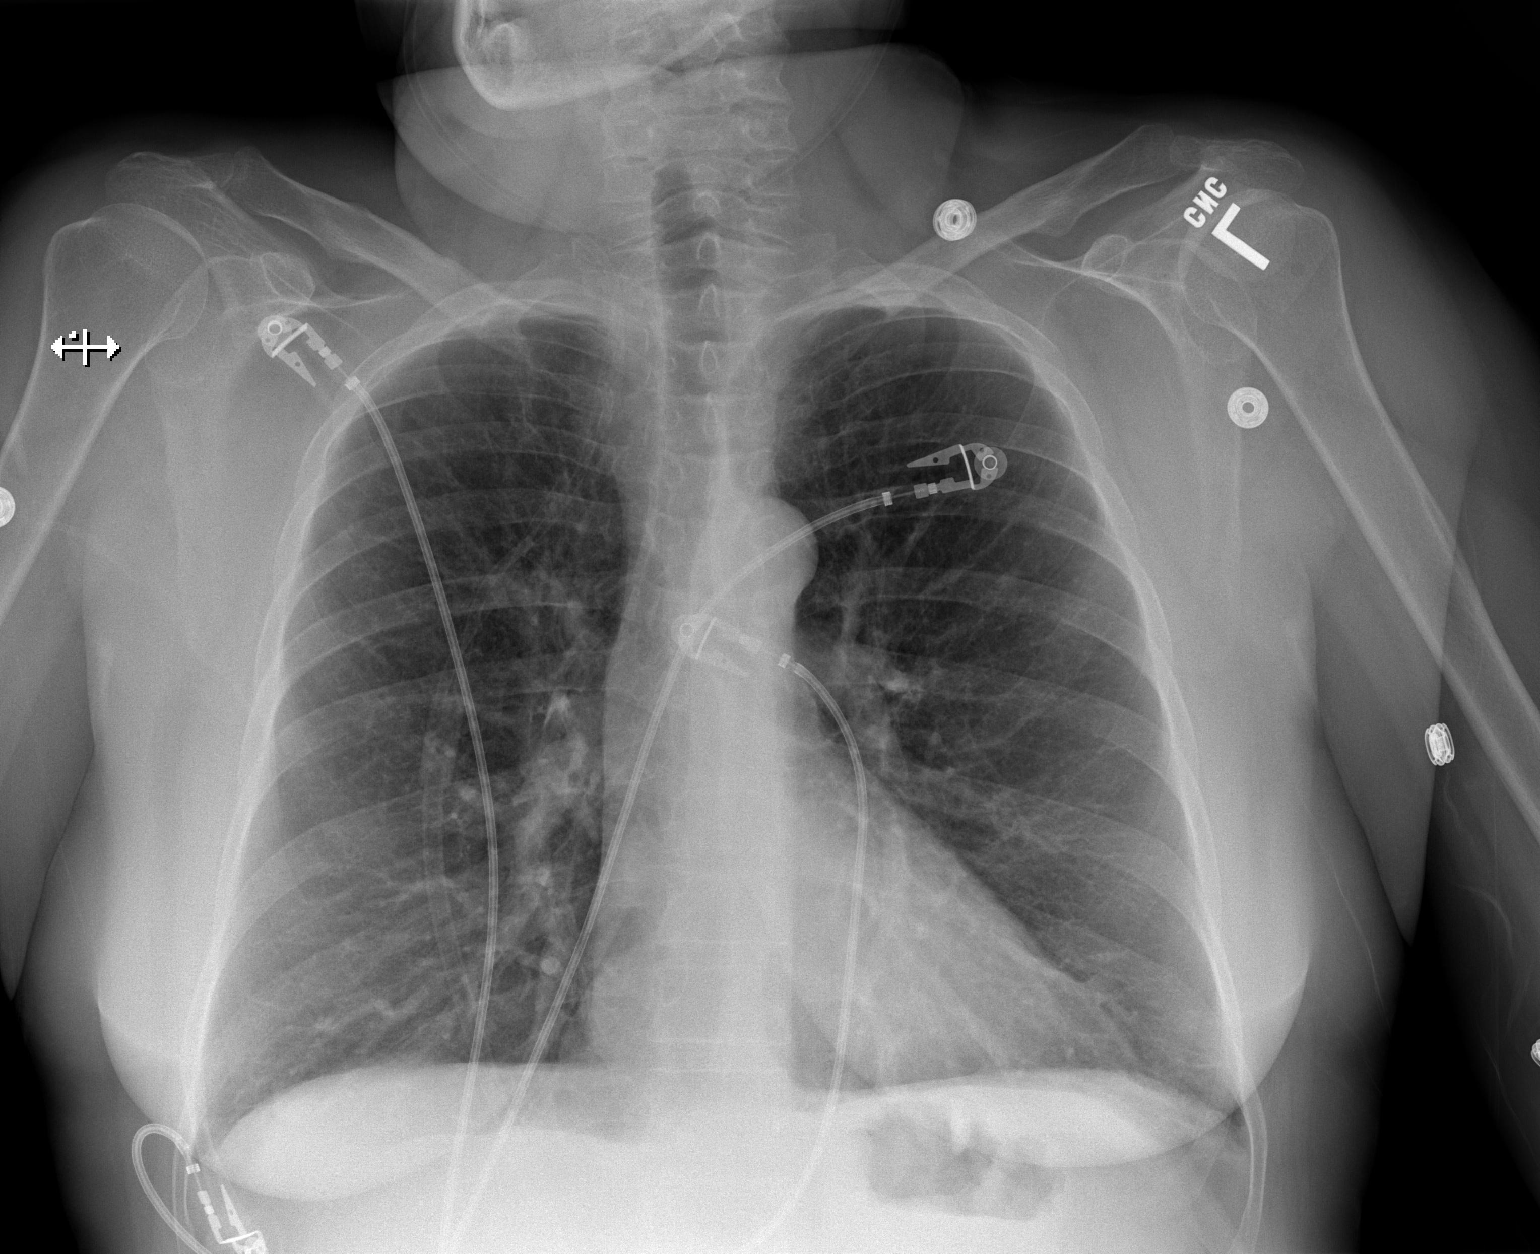
[im 2/2]
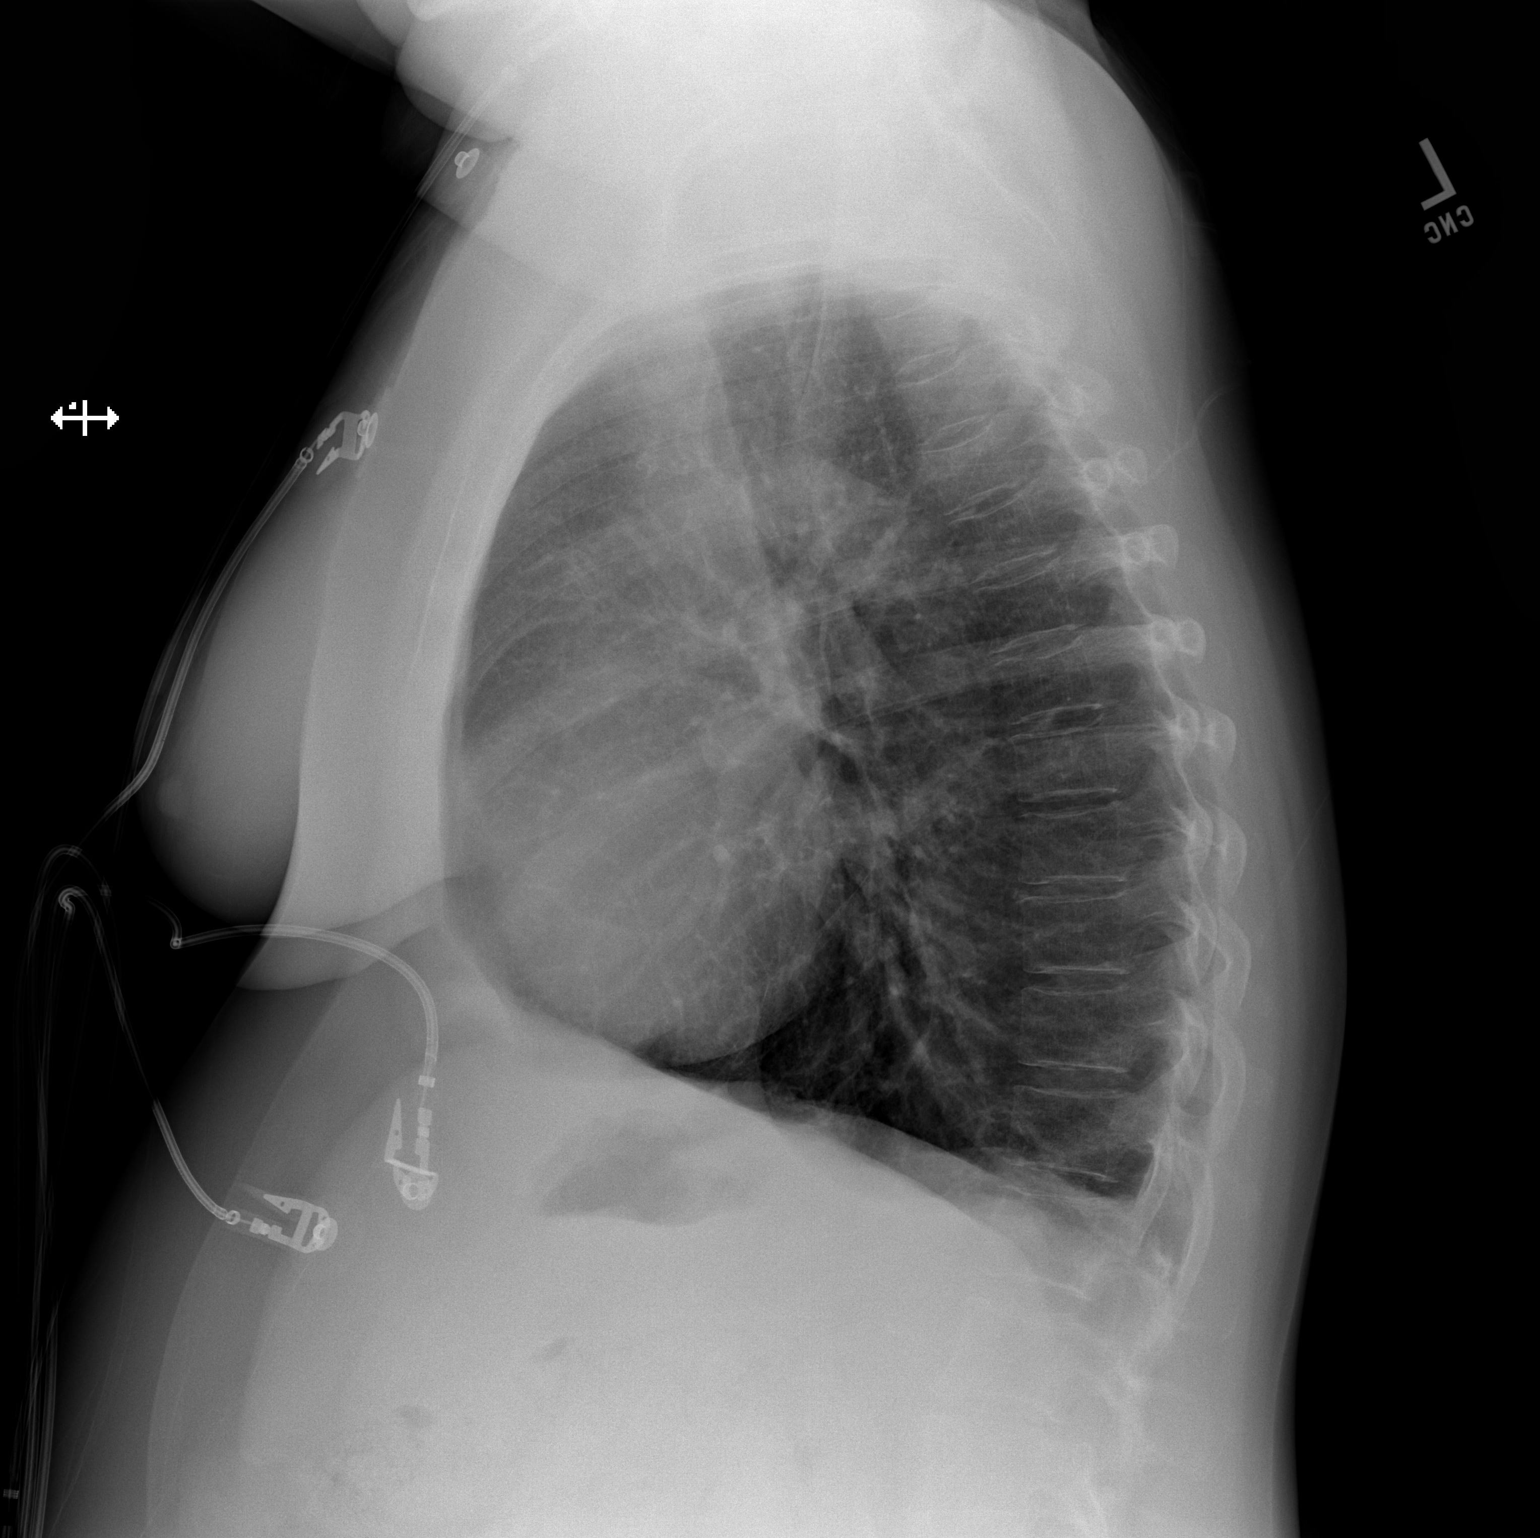

[2 of 2 positions shown; findings below may reference images not displayed]

FINDINGS: The heart and mediastinum are stable. No focal pulmonary opacities.
IMPRESSION: No acute cardiopulmonary disease.

[REDACTED]

## 2015-04-08 IMAGING — CR DG CHEST 1V PORT
1 series · 1 of 1 positions shown · non-contrast
Comparison: none

REASON FOR EXAM: Shortness of Breath
COMMENTS:

[ap]
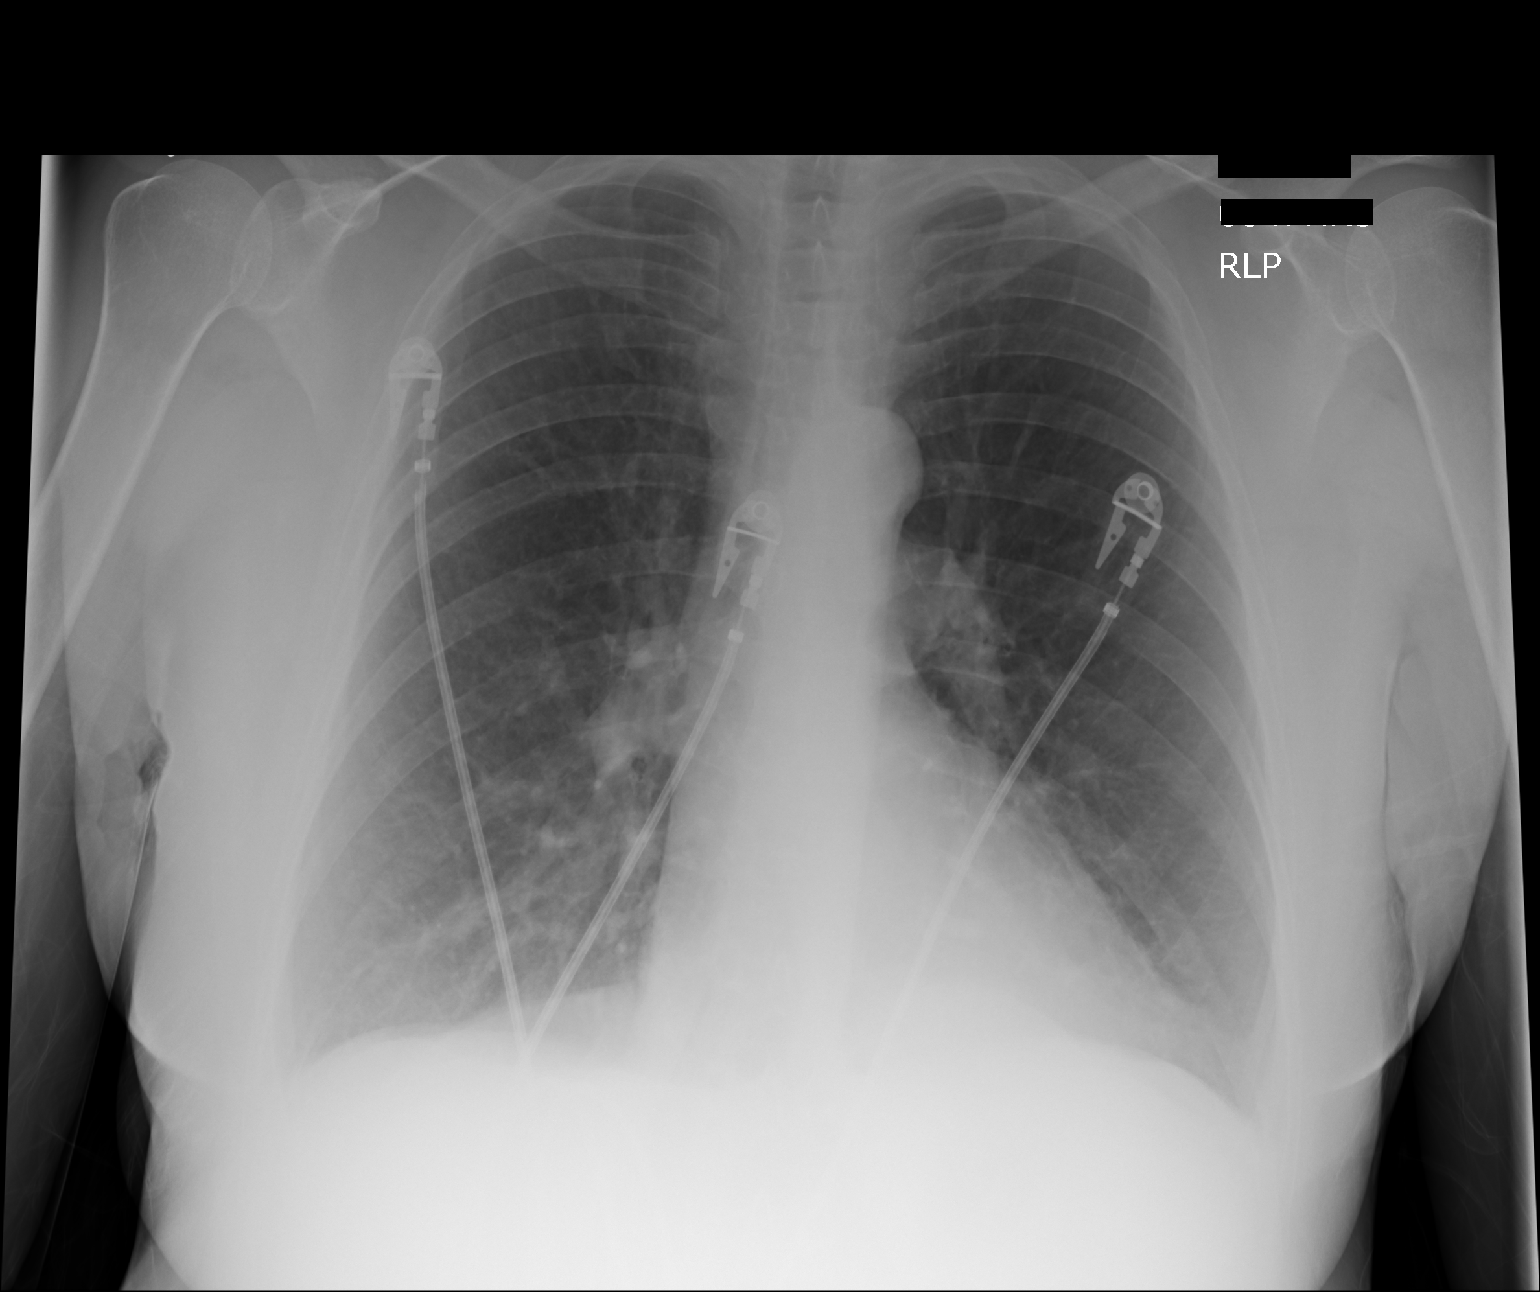

[1 of 1 positions shown; findings below may reference images not displayed]

PROCEDURE:     DXR - DXR PORTABLE CHEST SINGLE VIEW  - February 28, 2013  [DATE]

RESULT:     Comparison is made to the study February 06, 2013.

The lungs are hyperinflated with hemidiaphragm flattening. The cardiac
silhouette is normal in size. The pulmonary vascularity is not engorged. The
mediastinum is normal in width. There are coarse lung markings in the
infrahilar regions bilaterally which may reflect subsegmental atelectasis.
IMPRESSION: The findings are consistent with COPD. I cannot exclude
superimposed acute bronchitis with bibasilar subsegmental atelectasis. A
followup PA and lateral chest x-ray would be of value.

[REDACTED]
